# Patient Record
Sex: Female | Born: 1935 | Race: White | Hispanic: No | Smoking: Never smoker
Health system: Southern US, Community
[De-identification: ages and names within clinical notes are randomized; demographics above are authoritative.]

## PROBLEM LIST (undated history)

## (undated) DIAGNOSIS — I219 Acute myocardial infarction, unspecified: Secondary | ICD-10-CM

## (undated) DIAGNOSIS — E039 Hypothyroidism, unspecified: Secondary | ICD-10-CM

## (undated) DIAGNOSIS — I4891 Unspecified atrial fibrillation: Secondary | ICD-10-CM

## (undated) DIAGNOSIS — Z95810 Presence of automatic (implantable) cardiac defibrillator: Secondary | ICD-10-CM

## (undated) DIAGNOSIS — I10 Essential (primary) hypertension: Secondary | ICD-10-CM

## (undated) DIAGNOSIS — M1990 Unspecified osteoarthritis, unspecified site: Secondary | ICD-10-CM

## (undated) DIAGNOSIS — R011 Cardiac murmur, unspecified: Secondary | ICD-10-CM

## (undated) DIAGNOSIS — R42 Dizziness and giddiness: Secondary | ICD-10-CM

## (undated) DIAGNOSIS — E782 Mixed hyperlipidemia: Secondary | ICD-10-CM

## (undated) HISTORY — DX: Mixed hyperlipidemia: E78.2

## (undated) HISTORY — DX: Cardiac murmur, unspecified: R01.1

## (undated) HISTORY — PX: INSERT / REPLACE / REMOVE PACEMAKER: SUR710

## (undated) HISTORY — DX: Presence of automatic (implantable) cardiac defibrillator: Z95.810

## (undated) HISTORY — DX: Hypothyroidism, unspecified: E03.9

## (undated) HISTORY — DX: Unspecified osteoarthritis, unspecified site: M19.90

---

## 1998-03-30 ENCOUNTER — Other Ambulatory Visit: Admission: RE | Admit: 1998-03-30 | Discharge: 1998-03-30 | Payer: Self-pay | Admitting: *Deleted

## 1998-04-06 ENCOUNTER — Other Ambulatory Visit: Admission: RE | Admit: 1998-04-06 | Discharge: 1998-04-06 | Payer: Self-pay | Admitting: *Deleted

## 1999-04-12 ENCOUNTER — Other Ambulatory Visit: Admission: RE | Admit: 1999-04-12 | Discharge: 1999-04-12 | Payer: Self-pay | Admitting: *Deleted

## 2000-04-04 ENCOUNTER — Encounter: Payer: Self-pay | Admitting: *Deleted

## 2000-04-04 ENCOUNTER — Encounter: Admission: RE | Admit: 2000-04-04 | Discharge: 2000-04-04 | Payer: Self-pay | Admitting: *Deleted

## 2000-06-06 ENCOUNTER — Encounter: Admission: RE | Admit: 2000-06-06 | Discharge: 2000-06-06 | Payer: Self-pay | Admitting: *Deleted

## 2000-06-06 ENCOUNTER — Encounter: Payer: Self-pay | Admitting: *Deleted

## 2000-06-13 ENCOUNTER — Encounter: Payer: Self-pay | Admitting: *Deleted

## 2000-06-13 ENCOUNTER — Encounter: Admission: RE | Admit: 2000-06-13 | Discharge: 2000-06-13 | Payer: Self-pay | Admitting: *Deleted

## 2001-06-18 ENCOUNTER — Encounter: Admission: RE | Admit: 2001-06-18 | Discharge: 2001-06-18 | Payer: Self-pay | Admitting: Internal Medicine

## 2001-06-18 ENCOUNTER — Encounter: Payer: Self-pay | Admitting: Internal Medicine

## 2002-07-22 ENCOUNTER — Encounter: Admission: RE | Admit: 2002-07-22 | Discharge: 2002-07-22 | Payer: Self-pay | Admitting: Internal Medicine

## 2002-07-22 ENCOUNTER — Encounter: Payer: Self-pay | Admitting: Internal Medicine

## 2003-09-01 ENCOUNTER — Encounter: Admission: RE | Admit: 2003-09-01 | Discharge: 2003-09-01 | Payer: Self-pay | Admitting: Internal Medicine

## 2003-09-01 ENCOUNTER — Encounter: Payer: Self-pay | Admitting: Internal Medicine

## 2004-09-20 ENCOUNTER — Encounter: Admission: RE | Admit: 2004-09-20 | Discharge: 2004-09-20 | Payer: Self-pay | Admitting: Internal Medicine

## 2005-10-29 ENCOUNTER — Inpatient Hospital Stay (HOSPITAL_COMMUNITY): Admission: AD | Admit: 2005-10-29 | Discharge: 2005-11-09 | Payer: Self-pay | Admitting: Cardiology

## 2005-10-30 ENCOUNTER — Ambulatory Visit: Payer: Self-pay | Admitting: Internal Medicine

## 2005-10-30 ENCOUNTER — Encounter: Payer: Self-pay | Admitting: Internal Medicine

## 2005-11-01 ENCOUNTER — Ambulatory Visit: Payer: Self-pay | Admitting: Pulmonary Disease

## 2005-11-01 ENCOUNTER — Encounter: Payer: Self-pay | Admitting: Cardiology

## 2005-11-01 ENCOUNTER — Ambulatory Visit: Payer: Self-pay | Admitting: Cardiology

## 2005-11-17 ENCOUNTER — Ambulatory Visit: Payer: Self-pay | Admitting: Internal Medicine

## 2005-12-15 ENCOUNTER — Observation Stay (HOSPITAL_COMMUNITY): Admission: AD | Admit: 2005-12-15 | Discharge: 2005-12-16 | Payer: Self-pay | Admitting: Internal Medicine

## 2005-12-16 ENCOUNTER — Ambulatory Visit: Payer: Self-pay | Admitting: Internal Medicine

## 2005-12-28 ENCOUNTER — Ambulatory Visit: Payer: Self-pay | Admitting: Internal Medicine

## 2006-03-13 ENCOUNTER — Ambulatory Visit: Payer: Self-pay | Admitting: Internal Medicine

## 2006-05-01 ENCOUNTER — Ambulatory Visit: Payer: Self-pay | Admitting: Internal Medicine

## 2006-06-11 ENCOUNTER — Ambulatory Visit: Payer: Self-pay

## 2006-08-03 ENCOUNTER — Ambulatory Visit: Payer: Self-pay | Admitting: Internal Medicine

## 2006-10-30 ENCOUNTER — Ambulatory Visit: Payer: Self-pay | Admitting: Internal Medicine

## 2006-12-26 ENCOUNTER — Ambulatory Visit: Payer: Self-pay | Admitting: Internal Medicine

## 2007-01-10 ENCOUNTER — Ambulatory Visit: Payer: Self-pay | Admitting: Internal Medicine

## 2007-01-10 LAB — CONVERTED CEMR LAB
AST: 35 units/L (ref 0–37)
Albumin: 3.5 g/dL (ref 3.5–5.2)
Bilirubin, Direct: 0.1 mg/dL (ref 0.0–0.3)
Total Bilirubin: 0.7 mg/dL (ref 0.3–1.2)
Total Protein: 6.9 g/dL (ref 6.0–8.3)

## 2007-04-05 ENCOUNTER — Ambulatory Visit: Payer: Self-pay | Admitting: Internal Medicine

## 2007-08-02 ENCOUNTER — Encounter: Admission: RE | Admit: 2007-08-02 | Discharge: 2007-08-02 | Payer: Self-pay | Admitting: Internal Medicine

## 2007-08-16 ENCOUNTER — Ambulatory Visit (HOSPITAL_COMMUNITY): Admission: RE | Admit: 2007-08-16 | Discharge: 2007-08-16 | Payer: Self-pay | Admitting: Interventional Radiology

## 2007-08-16 ENCOUNTER — Encounter: Admission: RE | Admit: 2007-08-16 | Discharge: 2007-08-16 | Payer: Self-pay | Admitting: Internal Medicine

## 2007-08-23 ENCOUNTER — Encounter: Admission: RE | Admit: 2007-08-23 | Discharge: 2007-08-23 | Payer: Self-pay | Admitting: Radiology

## 2007-09-02 ENCOUNTER — Ambulatory Visit (HOSPITAL_COMMUNITY): Admission: RE | Admit: 2007-09-02 | Discharge: 2007-09-02 | Payer: Self-pay | Admitting: Radiology

## 2007-09-02 ENCOUNTER — Encounter (INDEPENDENT_AMBULATORY_CARE_PROVIDER_SITE_OTHER): Payer: Self-pay | Admitting: Radiology

## 2007-09-16 ENCOUNTER — Encounter: Admission: RE | Admit: 2007-09-16 | Discharge: 2007-09-16 | Payer: Self-pay | Admitting: Radiology

## 2007-10-23 ENCOUNTER — Encounter: Admission: RE | Admit: 2007-10-23 | Discharge: 2007-10-23 | Payer: Self-pay | Admitting: Radiology

## 2007-10-28 ENCOUNTER — Encounter: Admission: RE | Admit: 2007-10-28 | Discharge: 2007-10-28 | Payer: Self-pay | Admitting: Radiology

## 2007-11-06 ENCOUNTER — Encounter: Admission: RE | Admit: 2007-11-06 | Discharge: 2007-11-06 | Payer: Self-pay | Admitting: Radiology

## 2007-11-18 ENCOUNTER — Encounter: Admission: RE | Admit: 2007-11-18 | Discharge: 2007-11-18 | Payer: Self-pay | Admitting: Radiology

## 2008-01-03 ENCOUNTER — Ambulatory Visit: Payer: Self-pay | Admitting: Internal Medicine

## 2008-01-28 ENCOUNTER — Ambulatory Visit: Payer: Self-pay | Admitting: Internal Medicine

## 2008-01-28 LAB — CONVERTED CEMR LAB
ALT: 33 units/L (ref 0–35)
AST: 41 units/L — ABNORMAL HIGH (ref 0–37)
Alkaline Phosphatase: 58 units/L (ref 39–117)
Basophils Relative: 0.3 % (ref 0.0–1.0)
Bilirubin, Direct: 0.1 mg/dL (ref 0.0–0.3)
Eosinophils Absolute: 0.1 10*3/uL (ref 0.0–0.6)
Eosinophils Relative: 0.7 % (ref 0.0–5.0)
HCT: 42.2 % (ref 36.0–46.0)
Neutrophils Relative %: 70.8 % (ref 43.0–77.0)
Platelets: 252 10*3/uL (ref 150–400)
RBC: 4.26 M/uL (ref 3.87–5.11)
RDW: 14.2 % (ref 11.5–14.6)
Total Protein: 7.3 g/dL (ref 6.0–8.3)
WBC: 8.1 10*3/uL (ref 4.5–10.5)

## 2008-03-18 ENCOUNTER — Encounter: Admission: RE | Admit: 2008-03-18 | Discharge: 2008-03-18 | Payer: Self-pay | Admitting: Neurology

## 2008-04-27 ENCOUNTER — Ambulatory Visit: Payer: Self-pay | Admitting: Internal Medicine

## 2008-07-27 ENCOUNTER — Ambulatory Visit: Payer: Self-pay | Admitting: Internal Medicine

## 2008-08-19 ENCOUNTER — Ambulatory Visit: Payer: Self-pay | Admitting: Internal Medicine

## 2008-10-26 ENCOUNTER — Ambulatory Visit: Payer: Self-pay | Admitting: Internal Medicine

## 2009-01-14 ENCOUNTER — Ambulatory Visit: Payer: Self-pay | Admitting: Internal Medicine

## 2009-03-16 ENCOUNTER — Encounter: Payer: Self-pay | Admitting: Internal Medicine

## 2009-04-22 ENCOUNTER — Encounter: Payer: Self-pay | Admitting: Internal Medicine

## 2009-04-29 ENCOUNTER — Ambulatory Visit: Payer: Self-pay | Admitting: Internal Medicine

## 2009-05-07 ENCOUNTER — Encounter: Payer: Self-pay | Admitting: Internal Medicine

## 2009-05-09 ENCOUNTER — Emergency Department (HOSPITAL_COMMUNITY): Admission: RE | Admit: 2009-05-09 | Discharge: 2009-05-09 | Payer: Self-pay | Admitting: Family Medicine

## 2009-08-16 DIAGNOSIS — Z8679 Personal history of other diseases of the circulatory system: Secondary | ICD-10-CM | POA: Insufficient documentation

## 2009-08-16 DIAGNOSIS — E039 Hypothyroidism, unspecified: Secondary | ICD-10-CM | POA: Insufficient documentation

## 2009-08-16 DIAGNOSIS — M129 Arthropathy, unspecified: Secondary | ICD-10-CM | POA: Insufficient documentation

## 2009-08-16 DIAGNOSIS — M810 Age-related osteoporosis without current pathological fracture: Secondary | ICD-10-CM | POA: Insufficient documentation

## 2009-08-16 DIAGNOSIS — E782 Mixed hyperlipidemia: Secondary | ICD-10-CM

## 2009-08-17 ENCOUNTER — Ambulatory Visit: Payer: Self-pay | Admitting: Internal Medicine

## 2009-08-17 DIAGNOSIS — I428 Other cardiomyopathies: Secondary | ICD-10-CM

## 2009-08-17 DIAGNOSIS — R42 Dizziness and giddiness: Secondary | ICD-10-CM

## 2009-11-15 ENCOUNTER — Ambulatory Visit: Payer: Self-pay | Admitting: Internal Medicine

## 2009-11-24 ENCOUNTER — Encounter: Payer: Self-pay | Admitting: Internal Medicine

## 2009-12-06 ENCOUNTER — Telehealth: Payer: Self-pay | Admitting: Internal Medicine

## 2009-12-29 ENCOUNTER — Telehealth: Payer: Self-pay | Admitting: Internal Medicine

## 2010-02-28 ENCOUNTER — Encounter: Payer: Self-pay | Admitting: Internal Medicine

## 2010-03-30 ENCOUNTER — Encounter: Payer: Self-pay | Admitting: Internal Medicine

## 2010-03-30 ENCOUNTER — Ambulatory Visit: Payer: Self-pay

## 2010-08-16 ENCOUNTER — Telehealth: Payer: Self-pay | Admitting: Internal Medicine

## 2010-08-19 ENCOUNTER — Inpatient Hospital Stay (HOSPITAL_COMMUNITY): Admission: EM | Admit: 2010-08-19 | Discharge: 2010-08-25 | Payer: Self-pay | Admitting: Emergency Medicine

## 2010-10-13 ENCOUNTER — Emergency Department (HOSPITAL_COMMUNITY): Admission: EM | Admit: 2010-10-13 | Discharge: 2010-10-13 | Payer: Self-pay | Admitting: Emergency Medicine

## 2010-11-03 ENCOUNTER — Encounter (INDEPENDENT_AMBULATORY_CARE_PROVIDER_SITE_OTHER): Payer: Self-pay | Admitting: *Deleted

## 2010-11-15 ENCOUNTER — Ambulatory Visit: Payer: Self-pay | Admitting: Internal Medicine

## 2010-11-15 DIAGNOSIS — I359 Nonrheumatic aortic valve disorder, unspecified: Secondary | ICD-10-CM | POA: Insufficient documentation

## 2011-01-08 ENCOUNTER — Encounter: Payer: Self-pay | Admitting: Radiology

## 2011-01-08 ENCOUNTER — Encounter: Payer: Self-pay | Admitting: Internal Medicine

## 2011-01-19 NOTE — Progress Notes (Signed)
Summary: pt has questions   Phone Note Call from Patient Call back at [phone]   Caller: Friend/Kay Ann Maki Reason for Call: Talk to Nurse, Talk to Doctor Summary of Call: pt can not make appt because she is not feeling well and can't make an appt she is very concerned about being charged the $50.00 Initial call taken by: Omer Jack,  August 16, 2010 8:45 AM  Follow-up for Phone Call        PT'S FRIEND INFORMED WILL NOT BE CHARGED THE $50.00 CX FEE. Follow-up by: Scherrie Bateman, LPN,  August 16, 2010 10:25 AM

## 2011-01-19 NOTE — Cardiovascular Report (Signed)
Summary: Office Visit   Office Visit   Imported By: Roderic Ovens 04/06/2010 16:25:18  _____________________________________________________________________  External Attachment:    Type:   Image     Comment:   External Document

## 2011-01-19 NOTE — Progress Notes (Signed)
Summary: med question   Phone Note Call from Patient Call back at Home Phone [phone]   Caller: Patient Summary of Call: is she still suppose to be taking carvedilol? Initial call taken by: Migdalia Dk,  December 29, 2009 10:46 AM  Follow-up for Phone Call        Pt was confused, someone told her that she was not supposed to be taking anything from Dr. Graciela Husbands. She just wanted to make sure. I do not see any notes where at any time this was stopped. So, she should be taking it.  Follow-up by: Duncan Dull, RN, BSN,  December 29, 2009 11:08 AM

## 2011-01-19 NOTE — Assessment & Plan Note (Signed)
Summary: pc2  Medications Added LEVOTHYROXINE SODIUM 50 MCG TABS (LEVOTHYROXINE SODIUM) 1 tablet once daily LYRICA 25 MG CAPS (PREGABALIN) once daily MIRTAZAPINE 15 MG TABS (MIRTAZAPINE) 1/2 tablet once daily ROPINIROLE HCL 0.25 MG TABS (ROPINIROLE HCL) once daily DONEPEZIL HCL 10 MG TABS (DONEPEZIL HCL) once daily      Allergies Added: NKDA  CC:  dizziness.  History of Present Illness: [name] [name] is seen in followup for polymorphic ventricular tachycardia and syncope for which she takes amiodarone. She has a nonischemic cardiomyopathy.   She continues to complain of dizziness. According to her she has not been referred to neurology. I think we have to make a decision as related to the amiodarone at least until we can exclude that.  She is also having problems with anorexia which prompted a temporary nursing home stay for which he is just recently returned home.  The patient denies SOB, chest pain, edema or palpitations    Problems Prior to Update: 1)  Aortic Stenosis  (ICD-424.1) 2)  Ventricular Tachycardia-polymorphic  (ICD-427.1) 3)  Implantation of Defibrillator,st J  (ICD-V45.02) 4)  Dizziness, Chronic  (ICD-780.4) 5)  Cardiomyopathy, Primary, Dilated  (ICD-425.4) 6)  Hyperlipidemia, Mixed  (ICD-272.2) 7)  Heart Murmur, Hx of  (ICD-V12.50) 8)  Arthritis  (ICD-716.90) 9)  Osteoporosis  (ICD-733.00) 10)  Hypothyroidism  (ICD-244.9)  Current Medications (verified): 1)  Amiodarone Hcl 200 Mg Tabs (Amiodarone Hcl) .... Take One Tablet Once Daily 2)  Aspirin 81 Mg Tbec (Aspirin) .... Take One Tablet By Mouth Daily 3)  Levothyroxine Sodium 50 Mcg Tabs (Levothyroxine Sodium) .Marland Kitchen.. 1 Tablet Once Daily 4)  Preservision/lutein  Caps (Multiple Vitamins-Minerals) .... Once Daily 5)  Vitamin D3 2000 Unit Caps (Cholecalciferol) .... Once Daily 6)  Simvastatin 40 Mg Tabs (Simvastatin) .... At Bedtime 7)  Coreg 3.125 Mg Tabs (Carvedilol) .... Once Daily 8)  Folast 2.8-25-2 Mg Tabs  (L-Methylfolate-B6-B12) .... One By Mouth Daily 9)  Fludrocortisone Acetate 0.1 Mg Tabs (Fludrocortisone Acetate) .... One By Mouth Daily 10)  Lyrica 25 Mg Caps (Pregabalin) .... Once Daily 11)  Mirtazapine 15 Mg Tabs (Mirtazapine) .... 1/2 Tablet Once Daily 12)  Ropinirole Hcl 0.25 Mg Tabs (Ropinirole Hcl) .... Once Daily 13)  Donepezil Hcl 10 Mg Tabs (Donepezil Hcl) .... Once Daily  Allergies (verified): No Known Drug Allergies  Past History:  Past Medical History: Last updated: 08/16/2009 Current Problems:  HYPERLIPIDEMIA, MIXED (ICD-272.2) HEART MURMUR, HX OF (ICD-V12.50) ARTHRITIS (ICD-716.90) OSTEOPOROSIS (ICD-733.00) HYPOTHYROIDISM (ICD-244.9)    Family History: Last updated: 08/16/2009 Family History of Coronary Artery Disease:  Family History of CVA or Stroke:   Social History: Last updated: 08/16/2009 Retired  Widowed  Tobacco Use - No.  Alcohol Use - no Drug Use - no  Risk Factors: Smoking Status: never (08/16/2009)  Vital Signs:  Patient profile:   77 year old female Height:      60 inches Weight:      98.19 pounds BMI:     19.25 Pulse rate:   65 / minute BP sitting:   137 / 76  (left arm) Cuff size:   regular  Vitals Entered By: Caralee Ates CMA (November 15, 2010 9:43 AM)  Physical Exam  General:  The patient was alert and oriented in no acute distress. HEENT Normal.  Neck veins were flat, carotids were delayed Lungs were clear.  Heart sounds were regular with A 2/6 systolic murmur with an single second heart sound Abdomen was soft with active bowel sounds. There is no clubbing  cyanosis or edema. Skin Warm and dry     ICD Specifications Following MD:  Sherryl Manges, MD     ICD Vendor:  St Jude     ICD Model Number:  [id number]     ICD Serial Number:  [id number] ICD DOI:  12/15/2005     ICD Implanting MD:  Sherryl Manges, MD  Lead 1:    Location: RV     DOI: 12/15/2005     Model #: 7000     Serial #: [id number]     Status: active  Indications::   PMVT   ICD Follow Up Battery Voltage:  2.55 V     Charge Time:  13.4 seconds     Underlying rhythm:  SR ICD Dependent:  No       ICD Device Measurements Right Ventricle:  Amplitude: 12.0 mV, Impedance: 345 ohms, Threshold: 2.0 V at 0.5 msec Shock Impedance: 29 ohms   Episodes MS Episodes:  0     Coumadin:  No Shock:  0     ATP:  0     Nonsustained:  0     Atrial Therapies:  0 Ventricular Pacing:  <1%  Brady Parameters Mode VVI     Lower Rate Limit:  40      Tachy Zones VF:  231     VT:  185     Tech Comments:  NORMAL DEVICE FUNCTION.  NO EPISODES SINCE LAST CHECK.  CHANGED RV OUTPUT FROM 2.75 TO 3.5 V. BATTERY VOLTAGE 2.55--PER SJM BATTERY LONGEVITY 8-12 MTHS TO ERI. ROV IN 3 MTHS W/DEVICE CLINIC. Vella Kohler  November 15, 2010 9:45 AM  Impression & Recommendations:  Problem # 1:  VENTRICULAR TACHYCARDIA-POLYMORPHIC (ICD-427.1)  The patient takes amiodarone; because of the potential treatable side effects i.e. dizziness and anorexia, we will stop the amiodarone. I have some concerns about this is related to her ventricular tachycardia.    Problem # 2:  DIZZINESS, CHRONIC (ICD-780.4) as above; she is still on Florinef.  Problem # 3:  AORTIC STENOSIS (ICD-424.1) haortic stenosis was moderate by echo in 2006. Given her age and comorbidities, I am not inclined to pursue therapy of it at this time.  Her updated medication list for this problem includes:    Coreg 3.125 Mg Tabs (Carvedilol) ..... Once daily  Problem # 4:  CARDIOMYOPATHY, PRIMARY, DILATED (ICD-425.4)  we'll continue her on her current medications;  I need to review the old records to see whether she is a candidate for an ACE inhibitor given her LV dysfunction. Prior to initiating at the eye would recheck echo as to avoid the potential morbidities of the medication  The following medications were removed from the medication list:    Amiodarone Hcl 200 Mg Tabs (Amiodarone hcl) .Marland Kitchen... Take one tablet once daily     Amlodipine Besylate 5 Mg Tabs (Amlodipine besylate) ..... Once daily Her updated medication list for this problem includes:    Aspirin 81 Mg Tbec (Aspirin) .Marland Kitchen... Take one tablet by mouth daily    Coreg 3.125 Mg Tabs (Carvedilol) ..... Once daily  Patient Instructions: 1)  Your physician recommends that you schedule a follow-up appointment in: 3 months with pacer clinic 2)  Your physician has recommended you make the following change in your medication: Stop Amiodarone 3)  Your physician wants you to follow-up in:  6 months. You will receive a reminder letter in the mail two months in advance. If you don't receive a letter, please call our office  to schedule the follow-up appointment.

## 2011-01-19 NOTE — Cardiovascular Report (Signed)
Summary: Office Visit   Office Visit   Imported By: Roderic Ovens 11/21/2010 16:16:28  _____________________________________________________________________  External Attachment:    Type:   Image     Comment:   External Document

## 2011-01-19 NOTE — Letter (Signed)
Summary: Device-Delinquent Check  [facility], Main Office  [address]   [place], [place] [address]   Phone: [phone]  Fax: [phone]     November 03, 2010 MRN: [id number]   JOYCE HEITMAN [address] [place], [place]  [address]   Dear Ms. Cleary,  According to our records, you have not had your implanted device checked in the recommended period of time.  We are unable to determine appropriate device function without checking your device on a regular basis.  Please call our office to schedule an appointment , with Dr Graciela Husbands, as soon as possible.  If you are having your device checked by another physician, please call us so that we may update our records.  Thank you,  Letta Moynahan, EMT  November 03, 2010 11:17 AM  [facility]

## 2011-01-19 NOTE — Procedures (Signed)
Summary: defib [email or web address]  Medications Added * LEVOTHROID 50 MCG TABS (LEVOTHYROXINE SODIUM) one by mouth daily * SIMVASTATIN 40 MG TABS (SIMVASTATIN) at bedtime FOLAST 2.8-25-2 MG TABS (L-METHYLFOLATE-B6-B12) one by mouth daily FLUDROCORTISONE ACETATE 0.1 MG TABS (FLUDROCORTISONE ACETATE) one by mouth daily      Allergies Added: NKDA  Current Medications (verified): 1)  Amiodarone Hcl 200 Mg Tabs (Amiodarone Hcl) .... Take One Tablet Once Daily 2)  Aspirin 81 Mg Tbec (Aspirin) .... Take One Tablet By Mouth Daily 3)  Levothroid 50 Mcg Tabs (Levothyroxine Sodium) .... One By Mouth Daily 4)  Preservision/lutein  Caps (Multiple Vitamins-Minerals) .... Once Daily 5)  Vitamin D3 2000 Unit Caps (Cholecalciferol) .... Once Daily 6)  Simvastatin 40 Mg Tabs (Simvastatin) .... At Bedtime 7)  Amlodipine Besylate 5 Mg Tabs (Amlodipine Besylate) .... Once Daily 8)  Coreg 3.125 Mg Tabs (Carvedilol) .... Once Daily 9)  Folast 2.8-25-2 Mg Tabs (L-Methylfolate-B6-B12) .... One By Mouth Daily 10)  Fludrocortisone Acetate 0.1 Mg Tabs (Fludrocortisone Acetate) .... One By Mouth Daily  Allergies (verified): No Known Drug Allergies   ICD Specifications Following MD:  Sherryl Manges, MD     ICD Vendor:  St Jude     ICD Model Number:  [phone]     ICD Serial Number:  [id number] ICD DOI:  12/15/2005     ICD Implanting MD:  Sherryl Manges, MD  Lead 1:    Location: RV     DOI: 12/15/2005     Model #: 7000     Serial #: [id number]     Status: active  Indications::  PMVT   ICD Follow Up Remote Check?  No Battery Voltage:  2.61 V     Charge Time:  12.5 seconds     Underlying rhythm:  Brady @ 50 ICD Dependent:  No       ICD Device Measurements Right Ventricle:  Amplitude: 12 mV, Impedance: 345 ohms, Threshold: 1.75 V at 0.8 msec  Episodes Coumadin:  No Shock:  0     ATP:  0     Nonsustained:  0     Ventricular Pacing:  0%  Brady Parameters Mode VVI     Lower Rate Limit:  40      Tachy Zones VF:  231      VT:  185     Next Cardiology Appt Due:  06/17/2010 Tech Comments:  No parameter changes.  Device function normal.  ROV 3 months with Dr. Graciela Husbands.  We will restart Merlin @ that time. Altha Harm, LPN  March 30, 2010 2:26 PM

## 2011-01-19 NOTE — Letter (Signed)
Summary: Device-Delinquent Phone Journalist, newspaper, Main Office  [address]   [place], [place] [address]   Phone: [phone]  Fax: [phone]     February 28, 2010 MRN: [id number]   [name] [name] [address] [place], [place]  [address]   Dear Ms. Sobocinski,  According to our records, you were scheduled for a device phone transmission on  February 14, 2010.     We did not receive any results from this check.  If you transmitted on your scheduled day, please call us to help troubleshoot your system.  If you forgot to send your transmission, please send one upon receipt of this letter.  Thank you,   Architectural technologist Device Clinic

## 2011-01-31 ENCOUNTER — Encounter (INDEPENDENT_AMBULATORY_CARE_PROVIDER_SITE_OTHER): Payer: Self-pay | Admitting: *Deleted

## 2011-02-08 NOTE — Letter (Signed)
Summary: Appointment - Missed  [facility], Main Office  [address]   [place], [place] [address]   Phone: [phone]  Fax: [phone]     January 31, 2011 MRN: [id number]   [name] [name] [address] [place], [place]  [address]   Dear Ms. Paez,  Our records indicate you missed your appointment on 2.8.12 with the Device Clinic for your pacemaker check . It is very important that we reach you to reschedule this appointment. We look forward to participating in your health care needs. Please contact us at the number listed above at your earliest convenience to reschedule this appointment.     Sincerely,    Glass blower/designer

## 2011-02-16 ENCOUNTER — Encounter: Payer: Self-pay | Admitting: Internal Medicine

## 2011-02-16 ENCOUNTER — Encounter (INDEPENDENT_AMBULATORY_CARE_PROVIDER_SITE_OTHER): Payer: Medicare Other

## 2011-02-16 DIAGNOSIS — I428 Other cardiomyopathies: Secondary | ICD-10-CM

## 2011-03-01 LAB — RAPID URINE DRUG SCREEN, HOSP PERFORMED
Barbiturates: NOT DETECTED
Benzodiazepines: NOT DETECTED
Cocaine: NOT DETECTED

## 2011-03-01 LAB — URINALYSIS, ROUTINE W REFLEX MICROSCOPIC
Nitrite: NEGATIVE
Protein, ur: NEGATIVE mg/dL
Specific Gravity, Urine: 1.005 (ref 1.005–1.030)
Urobilinogen, UA: 0.2 mg/dL (ref 0.0–1.0)

## 2011-03-01 LAB — POCT CARDIAC MARKERS
CKMB, poc: 4.8 ng/mL (ref 1.0–8.0)
CKMB, poc: 5.3 ng/mL (ref 1.0–8.0)
Myoglobin, poc: 99.4 ng/mL (ref 12–200)
Troponin i, poc: <0.05 ng/mL (ref 0.00–0.09)
Troponin i, poc: <0.05 ng/mL (ref 0.00–0.09)

## 2011-03-01 LAB — DIFFERENTIAL
Basophils Absolute: 0 10*3/uL (ref 0.0–0.1)
Eosinophils Relative: 1 % (ref 0–5)
Lymphocytes Relative: 14 % (ref 12–46)
Lymphs Abs: 1.4 10*3/uL (ref 0.7–4.0)
Monocytes Absolute: 1.2 10*3/uL — ABNORMAL HIGH (ref 0.1–1.0)
Monocytes Relative: 12 % (ref 3–12)

## 2011-03-01 LAB — COMPREHENSIVE METABOLIC PANEL
AST: 33 U/L (ref 0–37)
Albumin: 3 g/dL — ABNORMAL LOW (ref 3.5–5.2)
Chloride: 102 mEq/L (ref 96–112)
Creatinine, Ser: 0.86 mg/dL (ref 0.4–1.2)
GFR calc Af Amer: >60 mL/min (ref >60)
Total Bilirubin: 0.6 mg/dL (ref 0.3–1.2)
Total Protein: 6.6 g/dL (ref 6.0–8.3)

## 2011-03-01 LAB — CBC
HCT: 35.4 % — ABNORMAL LOW (ref 36.0–46.0)
Hemoglobin: 12.1 g/dL (ref 12.0–15.0)
MCHC: 34.2 g/dL (ref 30.0–36.0)
MCV: 93.9 fL (ref 78.0–100.0)
Platelets: 219 10*3/uL (ref 150–400)
RDW: 13.9 % (ref 11.5–15.5)

## 2011-03-01 LAB — URINE MICROSCOPIC-ADD ON

## 2011-03-01 LAB — ETHANOL: Alcohol, Ethyl (B): <5 mg/dL (ref 0–10)

## 2011-03-01 LAB — URINE CULTURE

## 2011-03-01 LAB — TRICYCLICS SCREEN, URINE: TCA Scrn: NOT DETECTED

## 2011-03-02 LAB — TSH: TSH: 11.281 u[IU]/mL — ABNORMAL HIGH (ref 0.350–4.500)

## 2011-03-02 LAB — URINE MICROSCOPIC-ADD ON

## 2011-03-02 LAB — CBC
HCT: 36.4 % (ref 36.0–46.0)
Hemoglobin: 12.5 g/dL (ref 12.0–15.0)
MCH: 32.7 pg (ref 26.0–34.0)
MCHC: 35.8 g/dL (ref 30.0–36.0)
MCV: 91.4 fL (ref 78.0–100.0)
Platelets: 194 10*3/uL (ref 150–400)
RBC: 3.88 MIL/uL (ref 3.87–5.11)
RDW: 14.4 % (ref 11.5–15.5)
WBC: 6.8 10*3/uL (ref 4.0–10.5)

## 2011-03-02 LAB — URINALYSIS, ROUTINE W REFLEX MICROSCOPIC
Bilirubin Urine: NEGATIVE
Ketones, ur: NEGATIVE mg/dL
Nitrite: NEGATIVE
Specific Gravity, Urine: 1.008 (ref 1.005–1.030)
Urobilinogen, UA: 0.2 mg/dL (ref 0.0–1.0)
pH: 7.5 (ref 5.0–8.0)

## 2011-03-02 LAB — BASIC METABOLIC PANEL
BUN: 10 mg/dL (ref 6–23)
BUN: 13 mg/dL (ref 6–23)
CO2: 28 mEq/L (ref 19–32)
CO2: 31 mEq/L (ref 19–32)
Calcium: 8.4 mg/dL (ref 8.4–10.5)
Calcium: 8.8 mg/dL (ref 8.4–10.5)
Calcium: 9.3 mg/dL (ref 8.4–10.5)
Chloride: 92 mEq/L — ABNORMAL LOW (ref 96–112)
Chloride: 95 mEq/L — ABNORMAL LOW (ref 96–112)
Creatinine, Ser: 0.79 mg/dL (ref 0.4–1.2)
Creatinine, Ser: 0.87 mg/dL (ref 0.4–1.2)
GFR calc Af Amer: >60 mL/min (ref >60)
GFR calc Af Amer: >60 mL/min (ref >60)
GFR calc Af Amer: >60 mL/min (ref >60)
GFR calc non Af Amer: >60 mL/min (ref >60)
GFR calc non Af Amer: >60 mL/min (ref >60)
GFR calc non Af Amer: >60 mL/min (ref >60)
Glucose, Bld: 86 mg/dL (ref 70–99)
Glucose, Bld: 98 mg/dL (ref 70–99)
Potassium: 4.3 mEq/L (ref 3.5–5.1)
Potassium: 4.7 mEq/L (ref 3.5–5.1)
Sodium: 125 mEq/L — ABNORMAL LOW (ref 135–145)
Sodium: 128 mEq/L — ABNORMAL LOW (ref 135–145)
Sodium: 132 mEq/L — ABNORMAL LOW (ref 135–145)

## 2011-03-02 LAB — CK TOTAL AND CKMB (NOT AT ARMC)
CK, MB: 20 ng/mL (ref 0.3–4.0)
CK, MB: 8.4 ng/mL (ref 0.3–4.0)
Relative Index: 3.9 — ABNORMAL HIGH (ref 0.0–2.5)
Total CK: 199 U/L — ABNORMAL HIGH (ref 7–177)

## 2011-03-02 LAB — DIFFERENTIAL
Basophils Absolute: 0 10*3/uL (ref 0.0–0.1)
Basophils Relative: 0 % (ref 0–1)
Eosinophils Absolute: 0.1 10*3/uL (ref 0.0–0.7)
Eosinophils Relative: 1 % (ref 0–5)
Lymphs Abs: 1.6 10*3/uL (ref 0.7–4.0)
Neutrophils Relative %: 67 % (ref 43–77)

## 2011-03-02 LAB — URINE CULTURE
Culture  Setup Time: 201109030209
Culture: NO GROWTH

## 2011-03-02 LAB — TROPONIN I: Troponin I: 0.04 ng/mL (ref 0.00–0.06)

## 2011-03-02 LAB — CARDIAC PANEL(CRET KIN+CKTOT+MB+TROPI)
CK, MB: 18.7 ng/mL (ref 0.3–4.0)
Total CK: 447 U/L — ABNORMAL HIGH (ref 7–177)
Troponin I: 0.04 ng/mL (ref 0.00–0.06)

## 2011-03-02 LAB — T4, FREE: Free T4: 1.23 ng/dL (ref 0.80–1.80)

## 2011-03-02 LAB — HEPATIC FUNCTION PANEL
ALT: 42 U/L — ABNORMAL HIGH (ref 0–35)
Albumin: 3.7 g/dL (ref 3.5–5.2)
Alkaline Phosphatase: 60 U/L (ref 39–117)
Indirect Bilirubin: 0.5 mg/dL (ref 0.3–0.9)
Total Bilirubin: 0.6 mg/dL (ref 0.3–1.2)

## 2011-03-02 LAB — GLUCOSE, CAPILLARY: Glucose-Capillary: 95 mg/dL (ref 70–99)

## 2011-03-02 LAB — MAGNESIUM
Magnesium: 1.5 mg/dL (ref 1.5–2.5)
Magnesium: 1.7 mg/dL (ref 1.5–2.5)

## 2011-03-02 LAB — FOLATE: Folate: >20 ng/mL

## 2011-03-02 LAB — CK: Total CK: 408 U/L — ABNORMAL HIGH (ref 7–177)

## 2011-03-07 NOTE — Procedures (Signed)
Summary: device/ssaf mca      Allergies Added: NKDA  Current Medications (verified): 1)  Aspirin 81 Mg Tbec (Aspirin) .... Take One Tablet By Mouth Daily 2)  Levothyroxine Sodium 50 Mcg Tabs (Levothyroxine Sodium) .Marland Kitchen.. 1 Tablet Once Daily 3)  Preservision/lutein  Caps (Multiple Vitamins-Minerals) .... Once Daily 4)  Vitamin D3 2000 Unit Caps (Cholecalciferol) .... Once Daily 5)  Simvastatin 40 Mg Tabs (Simvastatin) .... At Bedtime 6)  Coreg 3.125 Mg Tabs (Carvedilol) .... Once Daily 7)  Folast 2.8-25-2 Mg Tabs (L-Methylfolate-B6-B12) .... One By Mouth Daily 8)  Fludrocortisone Acetate 0.1 Mg Tabs (Fludrocortisone Acetate) .... One By Mouth Daily 9)  Lyrica 25 Mg Caps (Pregabalin) .... Once Daily 10)  Mirtazapine 15 Mg Tabs (Mirtazapine) .... 1/2 Tablet Once Daily 11)  Ropinirole Hcl 0.25 Mg Tabs (Ropinirole Hcl) .... Once Daily 12)  Donepezil Hcl 10 Mg Tabs (Donepezil Hcl) .... Once Daily  Allergies (verified): No Known Drug Allergies   ICD Specifications Following MD:  Sherryl Manges, MD     ICD Vendor:  St Jude     ICD Model Number:  [phone]     ICD Serial Number:  [id number] ICD DOI:  12/15/2005     ICD Implanting MD:  Sherryl Manges, MD  Lead 1:    Location: RV     DOI: 12/15/2005     Model #: 7000     Serial #: [id number]     Status: active  Indications::  PMVT   ICD Follow Up ICD Dependent:  No      Episodes Coumadin:  No  Brady Parameters Mode VVI     Lower Rate Limit:  40      Tachy Zones VF:  231     VT:  185     Tech Comments:  see paceart report.

## 2011-03-07 NOTE — Cardiovascular Report (Signed)
Summary: Office Visit   Office Visit   Imported By: Roderic Ovens 02/27/2011 14:48:55  _____________________________________________________________________  External Attachment:    Type:   Image     Comment:   External Document

## 2011-05-02 NOTE — Letter (Signed)
January 28, 2008    [name] [name]. [name] [name], M.D.  [address]  [address]  [place], [place] [address]   RE:  [name], [name]  MRN:  [id number]  /  DOB:  28-Jan-1919   Dear [name] [name],   I hope you and [name] [name] and all are doing well.  I was sorry to hear about  [name] [name]'s departure.   [name] [name] is a lady that I have followed with him for some time.  She has a history of polymorphic ventricular tachycardia, status post  ICD implantation, and has been bothered by dizziness for some period of  time.  We initially thought this might be related to the amiodarone we  tried on and off by telephone to see if this had any impact and it did  not.  On further history today, it is clear that this is, at least in  large part, orthostatic and this was confirmed, see below.   Her other medications include amiodarone 200 a day and metoprolol 25 a  day.   EXAMINATION:  Her blood pressure is 146/75 with a pulse of 53.  With  standing, it went to 108/64 with gradual recovery.  LUNGS:  Her lungs were clear.  HEART:  Her heart sounds were regular.  EXTREMITIES:  Without edema.  SKIN:  Warm and dry.  NEUROLOGICAL EXAM:  Grossly normal.   Interrogation of her St.  Jude ICD demonstrates an R-wave with impedance  of 375, a threshold of 1.75 at 0.5.  Variability is 3.15.  There were  two intercurrent episodes, but both appeared to be sinus.   IMPRESSION:  1. Ventricular tachycardia - polymorphic.  2. Status post ICD for the above.  3. Recurrent dizziness, consistent with evidence of orthostatic      hypotension.  4. Coronary artery disease with near-normal left ventricular function,      prior MI and non-revascularizable.  5. Treated hypothyroidism.   DISCUSSION:  [name] [name], [name] [name] is doing pretty well.  We will check her  amiodarone and surveillance laboratories today.   The most impressive thing is her orthostatic hypotension and I have  suggested that she do the following.   1. Raise the head of her bed  up on 6-inch blocks.  2. Have a glass of water to drink when she gets started.  3. Take her shower at night instead of in the morning, because this is      a major problem for her.  4. I am going to give her a prescription for midodrine 2.5 mg to take      twice daily, which she is to start if she had persistent symptoms.      I have asked that she follow up with you within about four weeks to      assess that.   Thank you very much for allowing [place] to participate in her care.  Please  do not hesitate to contact me.    Sincerely,      Duke Salvia, MD, [facility]  Electronically Signed    SCK/MedQ  DD: 01/28/2008  DT: 01/29/2008  Job #: [id number]

## 2011-05-02 NOTE — Assessment & Plan Note (Signed)
[facility]                         ELECTROPHYSIOLOGY OFFICE NOTE   JASMAIN, AHLBERG                      MRN:          [id number]  DATE:01/14/2009                            DOB:          1919/03/04    [name] [name] is seen in followup because of arrhythmias identified as part  of remote followup for her ICD.  She has had no significant symptoms and  has had no ICD discharges.  Her functional status has remained stable.   MEDICATIONS:  1. Amiodarone 200 a day.  2. Amlodipine.  3. Simvastatin.  4. Levothyroxine.   PHYSICAL EXAMINATION:  VITAL SIGNS:  Her blood pressure today was 142/80  with a pulse of 84 with some orthostatic dizziness, but no orthostatic  hypotension except for transient drop from sitting to standing.  She  also had systolic supine hypertension of 168/91.  LUNGS:  Clear.  HEART:  Sounds were regular.  NECK:  The carotids were brisk and full without bruits.  ABDOMEN:  Soft.  EXTREMITIES:  No edema.   Interrogation of her St. Jude ICD demonstrated multiple episodes, all of  which have electrograms consistent with and were interpreted by the  device as SVT.  The cycle length was approximately 430 milliseconds.  We  reprogrammed the device to a 2-zone device at 185/230, and we began her  on carvedilol 3.125 twice daily as augmented rate control.   I mentioned to her that I anticipate Dr. Renae Gloss is following her  amiodarone surveillance labs easily, her liver, and her thyroid.     Duke Salvia, MD, [facility]  Electronically Signed    SCK/MedQ  DD: 01/14/2009  DT: 01/15/2009  Job #: [id number]   cc:   Merlene Laughter. Renae Gloss, M.D.

## 2011-05-02 NOTE — Consult Note (Signed)
NAME:  [name] [name], [name] [name]               ACCOUNT NO.:  [id number]   MEDICAL RECORD NO.:  [id number]          PATIENT TYPE:  EMS   LOCATION:  ED                           FACILITY:  [facility]   PHYSICIAN:  Stefani Dama, M.D.  DATE OF BIRTH:  [birth year]   DATE OF CONSULTATION:  05/09/2009  DATE OF DISCHARGE:                                 CONSULTATION   REASON FOR REQUEST:  Type 3 odontoid fracture.   HISTORY OF PRESENT ILLNESS:  [name] [name] is an 77 year old woman who a  week ago had sustained a fall.  She complained of neck pain and diffuse  aching, but was complaining mostly of lacerations and contusion about  her right frontal forehead.  Today, when she came back for suture  removal, she complained bitterly that she was having substantial neck  pain.  The doctor at the Urgent Care ordered a C-spine series and  ultimately a CT of the C-spine, which demonstrated a type 3 odontoid  fracture with minimal displacement.  She is seen now for further  consultation regarding this injury.  The patient gives a history of some  occasional falling episodes because of some hypotension that she has  experienced.  She is followed by Dr. Graciela Husbands.  He is her cardiologist and  she does have an implanted pacemaker and automatic defibrillator.  Her  motor function has been and remains normal.  She denies any numbness or  tingling in the upper extremities.  She denies any double vision,  ringing in the ears, or any other lower cranial nerve phenomenon.   PHYSICAL EXAMINATION:  She is awake, alert, oriented, individual.  Her  neck is stiff posteriorly particularly with some spasm in the trapezii  bilaterally.  Range of motion is severely limited allowing herself to  turn only 15 degrees to left than to the right.  She has otherwise been  placed in a hard cervical collar and motor function is normal in the  upper extremities and the deltoids, biceps, grips and intrinsics.  Sensation is intact distally in the  upper extremities bilaterally.   IMPRESSION:  The patient has evidence of a type 3 odontoid fracture with  minimal displacement.  At her age, this fracture will not likely heal,  but hopefully, it will fibrose in place.  I discussed with her the  treatment, which would involve using a hard cervical collar such as a  [place] J or an Aspen type collar for immobilization.  She has just been  placed in a [place] J type collar and she will remain in this for the next  8 weeks or so.  I have advised that we should have her come back to the  office in approximately 4 weeks for x-rays with flexion/extension views.  In the meantime, I have given her prescription for Darvocet-N 100, #40,  without refills.  She will be seen as an outpatient.      Stefani Dama, M.D.  Electronically Signed     HJE/MEDQ  D:  05/09/2009  T:  05/10/2009  Job:  [id number]

## 2011-05-02 NOTE — Letter (Signed)
August 19, 2008    Andi Devon, M.D.  [address]  [place], [place] [address]   RE:  [name] [name], [name] [name]  MRN:  [id number]  /  DOB:  [birth year]   Dear Selena Batten,   Ms. Schmutz was in today continued to complaint of dizziness.  Orthostatics today are recorded below, but are negative.  She says that  since she was put on the ProAmatine that she has had more problems with  dizziness as well as nocturnal polyuria.  She has also had more problems  with dizziness.   As I mentioned, orthostatics today were unrevealing with a blood  pressure of 115/60 with pulse of 49 without change with orthostatics  pressure.  Lungs were clear.  Heart sounds were regular.  Extremities  were without edema.   I should note that her resting blood pressure on arrival was 98/60.   Interrogation of her defibrillator demonstrates recurrent episodes of  the supraventricular tachycardia, which appears to be a sinus  tachycardia.   My thought was that we should go ahead and discontinue her metoprolol at  this point.   We will see her again in 1 year's time and continue to follow her and  monitor remotely in the interim.    Sincerely,      Duke Salvia, MD, [facility]  Electronically Signed    SCK/MedQ  DD: 08/19/2008  DT: 08/20/2008  Job #: [id number]

## 2011-05-05 ENCOUNTER — Inpatient Hospital Stay (HOSPITAL_COMMUNITY)
Admission: EM | Admit: 2011-05-05 | Discharge: 2011-05-13 | DRG: 100 | Disposition: A | Discharge status: Skilled Nursing Facility | Payer: Medicare Other | Attending: Internal Medicine | Admitting: Internal Medicine

## 2011-05-05 ENCOUNTER — Emergency Department (HOSPITAL_COMMUNITY): Payer: Medicare Other

## 2011-05-05 ENCOUNTER — Encounter (HOSPITAL_COMMUNITY): Payer: Self-pay | Admitting: Radiology

## 2011-05-05 DIAGNOSIS — R0902 Hypoxemia: Secondary | ICD-10-CM | POA: Diagnosis present

## 2011-05-05 DIAGNOSIS — N390 Urinary tract infection, site not specified: Secondary | ICD-10-CM | POA: Diagnosis present

## 2011-05-05 DIAGNOSIS — G40802 Other epilepsy, not intractable, without status epilepticus: Principal | ICD-10-CM | POA: Diagnosis present

## 2011-05-05 DIAGNOSIS — J9600 Acute respiratory failure, unspecified whether with hypoxia or hypercapnia: Secondary | ICD-10-CM | POA: Diagnosis present

## 2011-05-05 DIAGNOSIS — E876 Hypokalemia: Secondary | ICD-10-CM | POA: Diagnosis present

## 2011-05-05 DIAGNOSIS — F05 Delirium due to known physiological condition: Secondary | ICD-10-CM | POA: Diagnosis present

## 2011-05-05 DIAGNOSIS — E039 Hypothyroidism, unspecified: Secondary | ICD-10-CM | POA: Diagnosis present

## 2011-05-05 DIAGNOSIS — I472 Ventricular tachycardia, unspecified: Secondary | ICD-10-CM | POA: Diagnosis present

## 2011-05-05 DIAGNOSIS — G92 Toxic encephalopathy: Secondary | ICD-10-CM | POA: Diagnosis present

## 2011-05-05 DIAGNOSIS — E785 Hyperlipidemia, unspecified: Secondary | ICD-10-CM | POA: Diagnosis present

## 2011-05-05 DIAGNOSIS — Z7982 Long term (current) use of aspirin: Secondary | ICD-10-CM

## 2011-05-05 DIAGNOSIS — E46 Unspecified protein-calorie malnutrition: Secondary | ICD-10-CM | POA: Diagnosis present

## 2011-05-05 DIAGNOSIS — I4729 Other ventricular tachycardia: Secondary | ICD-10-CM | POA: Diagnosis present

## 2011-05-05 DIAGNOSIS — G929 Unspecified toxic encephalopathy: Secondary | ICD-10-CM | POA: Diagnosis present

## 2011-05-05 DIAGNOSIS — I951 Orthostatic hypotension: Secondary | ICD-10-CM | POA: Diagnosis present

## 2011-05-05 DIAGNOSIS — Z95810 Presence of automatic (implantable) cardiac defibrillator: Secondary | ICD-10-CM

## 2011-05-05 DIAGNOSIS — F0390 Unspecified dementia without behavioral disturbance: Secondary | ICD-10-CM | POA: Diagnosis present

## 2011-05-05 DIAGNOSIS — I10 Essential (primary) hypertension: Secondary | ICD-10-CM | POA: Diagnosis present

## 2011-05-05 DIAGNOSIS — E7800 Pure hypercholesterolemia, unspecified: Secondary | ICD-10-CM | POA: Diagnosis present

## 2011-05-05 DIAGNOSIS — I252 Old myocardial infarction: Secondary | ICD-10-CM

## 2011-05-05 HISTORY — DX: Acute myocardial infarction, unspecified: I21.9

## 2011-05-05 HISTORY — DX: Essential (primary) hypertension: I10

## 2011-05-05 LAB — COMPREHENSIVE METABOLIC PANEL
AST: 43 U/L — ABNORMAL HIGH (ref 0–37)
Albumin: 3.2 g/dL — ABNORMAL LOW (ref 3.5–5.2)
Alkaline Phosphatase: 82 U/L (ref 39–117)
Chloride: 100 mEq/L (ref 96–112)
Creatinine, Ser: 0.81 mg/dL (ref 0.4–1.2)
GFR calc Af Amer: >60 mL/min (ref >60)
Potassium: 3.9 mEq/L (ref 3.5–5.1)
Total Bilirubin: 0.3 mg/dL (ref 0.3–1.2)
Total Protein: 7.6 g/dL (ref 6.0–8.3)

## 2011-05-05 LAB — CSF CELL COUNT WITH DIFFERENTIAL
RBC Count, CSF: 197 /mm3 — ABNORMAL HIGH
Tube #: 4
WBC, CSF: 1 /mm3 (ref 0–5)

## 2011-05-05 LAB — URINALYSIS, ROUTINE W REFLEX MICROSCOPIC
Bilirubin Urine: NEGATIVE
Glucose, UA: NEGATIVE mg/dL
Specific Gravity, Urine: 1.014 (ref 1.005–1.030)
Urobilinogen, UA: 0.2 mg/dL (ref 0.0–1.0)

## 2011-05-05 LAB — GRAM STAIN

## 2011-05-05 LAB — CBC
MCH: 30.4 pg (ref 26.0–34.0)
MCHC: 34.1 g/dL (ref 30.0–36.0)
RDW: 14.3 % (ref 11.5–15.5)

## 2011-05-05 LAB — PROTIME-INR
INR: 0.91 (ref 0.00–1.49)
Prothrombin Time: 12.5 seconds (ref 11.6–15.2)

## 2011-05-05 LAB — URINE MICROSCOPIC-ADD ON

## 2011-05-05 LAB — DIFFERENTIAL
Basophils Absolute: 0 10*3/uL (ref 0.0–0.1)
Basophils Relative: 0 % (ref 0–1)
Eosinophils Absolute: 0.1 10*3/uL (ref 0.0–0.7)
Eosinophils Relative: 1 % (ref 0–5)
Monocytes Absolute: 0.7 10*3/uL (ref 0.1–1.0)
Monocytes Relative: 5 % (ref 3–12)

## 2011-05-05 LAB — TROPONIN I: Troponin I: <0.3 ng/mL (ref <0.30)

## 2011-05-05 LAB — CK TOTAL AND CKMB (NOT AT ARMC): Relative Index: 2.7 — ABNORMAL HIGH (ref 0.0–2.5)

## 2011-05-05 NOTE — Assessment & Plan Note (Signed)
[facility]                         ELECTROPHYSIOLOGY OFFICE NOTE   XOE, HOE                      MRN:          [id number]  DATE:01/10/2007                            DOB:          1919-03-29    Ms. Hedding is seen today.  She is status post ICD implantation for  polymorphic VT in the setting of ischemic heart disease with normal left  ventricular function.  She has chronic dizziness unrelated to  amiodarone.  She has had no complaints.   Her medications are reviewed.   EXAMINATION:  Her blood pressure is 139/82 with a pulse of 68.  LUNGS:  Clear.  HEART SOUNDS:  Regular.   Interrogation of her St. Jude Atlas [phone] demonstrated an R wave of 12  with an impedance of 385, a threshold of 1.25 and battery voltage of  3.2.  There were 15 episodes of SVT.   I have reprogrammed her device to a three zone device to create anti-  tachycardia pacing in zone 1.  We will see her again in 1 year.  She  will be followed via house call in the interim.     Duke Salvia, MD, [facility]  Electronically Signed    SCK/MedQ  DD: 01/10/2007  DT: 01/10/2007  Job #: [id number]   cc:   Olene Craven, M.D.

## 2011-05-05 NOTE — Discharge Summary (Signed)
NAMEANNEL, [name] [name]               ACCOUNT NO.:  [id number]   MEDICAL RECORD NO.:  [id number]          PATIENT TYPE:  INP   LOCATION:  6531                         FACILITY:  [facility]   PHYSICIAN:  Duke Salvia, M.D.  DATE OF BIRTH:  Nov 03, 1919   DATE OF ADMISSION:  12/15/2005  DATE OF DISCHARGE:  12/16/2005                                 DISCHARGE SUMMARY   PRINCIPAL DIAGNOSIS:  Syncope with history of polymorphic ventricular  tachycardia.   OTHER DIAGNOSES:  1.  Dizziness secondary to amiodarone and Toprol.  2.  Hyperlipidemia.  3.  Hypothyroidism.  4.  Osteoporosis.  5.  Osteoarthritis.  6.  Spinal fracture in the distant past secondary to trauma.  7.  History of heart murmur.  8.  Medically managed 2 vessel coronary artery disease.   ALLERGIES:  FLEXERIL, BENADRYL, DRAMAMINE (all causing mental status changes  in the past).   PROCEDURE:  Successful placement of a St. Jude Atlas II VR single lead AICD.   HISTORY OF PRESENT ILLNESS:  An 77 year old female with history as outlined  in principle and secondary diagnoses, who was admitted to [facility] October 30, 2005 following syncope with polymorphic ventricular  tachycardia and non-ST elevation MI. During that hospitalization, she  underwent cardiac catheterization revealing 2 vessel coronary disease, which  has been medically managed. On discharge, she was initiated on beta blocker  and amiodarone therapy, which has unfortunately caused her to experience  intolerable dizziness. She has subsequently followed up with Dr. Graciela Husbands and  had been taken off of her Toprol with continued symptoms and decision was  made to pursue placement of ICD and discontinuation of amiodarone.   HOSPITAL COURSE:  The patient presented to the EP lab on December 15, 2005  and underwent successful placement of a St. Jude Atlas II VR single lead  AICD. Defibrillation threshold testing was not performed secondary to non-  revascularized  coronary disease. She did have a small stable pocket hematoma  and she was felt satisfactory for discharge this morning. Her chest x-ray  shows no complications or evidence of pneumothorax.   LABORATORY DATA:  On discharge hemoglobin 13.2, hematocrit 39.2. WBC 10.9.  Platelets 399,000. Sodium 139, potassium 4.1, chloride 103, CO2 27, BUN 14,  creatinine 1.2, glucose 88, calcium 9.2.   DISPOSITION:  The patient is being discharged home today in good condition.   FOLLOW UP:  She will be contacted by [facility] for followup with  Dr. Graciela Husbands in approximately 3 months and with our ICD clinic in approximately  2 weeks.   DISCHARGE MEDICATIONS:  1.  Vytorin as previously prescribed.  2.  Plavix 75 mg daily.  3.  Enteric coated aspirin 325 mg daily.  4.  Synthroid 100 mcg daily.  5.  Quinine sulfate 325 mg daily for leg cramping.   OUTSTANDING LAB STUDIES:  None.   DURATION OF DISCHARGE ENCOUNTER:  40 minutes including physician time.      Ok Anis, NP    ______________________________  Duke Salvia, M.D.    CRB/MEDQ  D:  12/16/2005  T:  12/17/2005  Job:  [id number]   cc:   Olene Craven, M.D.  Fax: [phone]   Willa Rough, M.D.  [address]  [address]  [place]  [place] [address]   Duke Salvia, M.D.  [address]  [address]  [place]  [place] [address]

## 2011-05-05 NOTE — H&P (Signed)
[name] [name], [name] [name]               ACCOUNT NO.:  [id number]   MEDICAL RECORD NO.:  [id number]          PATIENT TYPE:  INP   LOCATION:  2301                         FACILITY:  [facility]   PHYSICIAN:  Marrian Salvage. Freida Busman, M.D. [facility] OF BIRTH:  [birth year]   DATE OF ADMISSION:  10/29/2005  DATE OF DISCHARGE:                                HISTORY & PHYSICAL   CHIEF COMPLAINT:  Syncope with VT storm.   PRIMARY CARE PHYSICIAN:  Olene Craven, M.D. in [place].   CARDIOLOGIST:  None.   ADMITTING PHYSICIAN:  Willa Rough, M.D.   HISTORY OF PRESENT ILLNESS:  The patient is an 77 year old female with  minimal past medical history and no known heart disease.  She is highly  functional and incredibly active, as demonstrated by the fact that she uses  a push mower 2 times a week to take care of her yard and does extensive  gardening.  She was in her usual state of health all week.  She worked in  her yard; however, she did report that she has not been sleeping well.  Otherwise, review of systems per the family was negative.  She went to her  family's house for dinner at noontime today, October 29, 2005.  While  sitting down getting ready to eat at approximately 1:30 p.m., she suddenly  passed out in her chair.  Her head went back, and her eyes rolled back into  her head.  She was out for approximately 30 seconds.  One of the family  members who was a nurse tried to take her carotid pulse and could not get a  pulse.  They shook her.  Eventually, she woke up.  Initially, she was mildly  somnolent, but by 5 minutes after the episode she was completely back to  normal.  Emergency medical services was called.  On arrival, her blood  pressure was 180/80, which was high for her, but otherwise there were no  abnormalities.  She was taken to [facility].   At [facility], EKG was normal.  Initial cardiac markers were negative  by point of care testing.  Consequently, they were getting  ready to  discharge her home, and then around 6:30 p.m. she passed out again briefly  while she was off the monitor.  She regained consciousness very rapidly on  her own.  Because of this, they put her back on telemetry.  At 6:40 p.m.,  the patient again passed out.  Telemetry at that time showed polymorphic  ventricular tachycardia with a rate of greater than 200.  The staff quickly  responded and delivered a single shock, which put her back into normal sinus  rhythm.  Around 7:30, she had another episode of polymorphic VT, which  spontaneously resolved.  She was started on amiodarone 300 mg and then 1 mg  per minute.  Arrangements were made to transfer the patient to [facility].   In getting ready to transfer, the patient was complaining of some leg  cramps.  She also had some soreness after shock therapy.  She was given some  Ativan and became relatively agitated.  She was then given some Benadryl  with markedly worse confusion at that time.  Some additional medications may  have been given.   On arrival to [facility] around 10 p.m., her vital signs were  stable, but she was extremely agitated.  She was not following commands.  She was combative and had good strength in all extremities with no evidence  of focal neurologic defects.  Unfortunately, due to her combative nature,  peripheral access was lost.  She then had a right IJ triple lumen catheter  placed without complications with staff helping to hold the patient in  place.  Chest x-ray confirmed that it was at the time of the right atrium.  She was then continued on her amiodarone.  She had also been started on  heparin and Integrilin just prior to transfer.  She had some rhonchi  throughout on physical examination.  Otherwise, heart was normal without  gallop or murmur.  She was given Lasix 40 IV and put out 1500 cc relatively  rapidly.  She was also given metoprolol 10 mg IV.  She then began to have   bradycardia into the 50s.  She also dropped her blood pressure down into the  80s/30s.  Between 11:30 p.m. and 1:30 a.m., she then proceeded to have 8  episodes of polymorphic VT requiring shock with 150 joules.  Each time, she  easily responded to therapy.  Laboratories showed a potassium of 3.6 at the  outside hospital, and potassium of 3.2 here, so that was repleted.  She was  also given empiric magnesium.  Her amiodarone was re-bolused times another  150 mg and was continued.  She was then later given a lidocaine 65 mg bolus,  and then started at a drip of 1 mg per hour.  These interventions did not  have immediate benefit.  Finally, because of her increasing somnolence and  repeated shocks, a decision was made to electively intubate her.  The  anesthesia team did so with some sedation and paralytic.  Soon after  intubation, her blood pressure improved transiently, and her heart rate came  up into the 70s.   Interestingly, the patient's EKG was basically normal with no significant Q  waves, no ST depression or elevation, and no T wave inversion.  Furthermore,  her QTC was approximately 400 without significant prolongment.  Furthermore,  her cardiac markers have only been mildly positive.  Here, her troponin was  0.17 with an MB of 17.  At the outside hospital, her markers were mildly  positive, as well.  She also has never had any chest pain while she was  lucid.  Echocardiogram was performed at the bedside by me, which showed very  good sound transmission with clearly normal left ventricular systolic  function with an ejection fraction of 60%.  The ventricular dimensions were  normal.  The aortic valve was mildly thickened, but there was no obvious AI  or stenosis.  There was trace mitral regurgitation.  Right ventricular  function appearing grossly normal.  Dr. Myrtis Ser and I spoke about the case and  decided that despite the fact that polymorphic VT is often driven by ischemia, there  was no evidence in this case that the patient was having  ventricular ischemia.  Consequently, we are continuing medical management.  Dr. Lewayne Bunting will be involved in her care from an EP prospective.   PAST MEDICAL HISTORY:  1.  Hypothyroidism.  2.  Hyperlipidemia with total cholesterol of 258.  3.  Osteoporosis.  4.  Arthritis.  5.  Spinal fracture in the distant past, status post trauma.  6.  History of heart murmur.   ALLERGIES:  1.  FLEXERIL.  2.  BENADRYL.  3.  DRAMAMINE.  (All have caused severe change in mental status in the past.)   CURRENT MEDICATIONS:  1.  Zocor 40 mg daily, which was supposed to be changed to a more potent      medication, but had not been done yet.  2.  Synthroid, unclear dose.  3.  Centrum multivitamin.  4.  Aspirin 81 mg daily.  5.  Fosamax was prescribed, but she has not been taking it.   SOCIAL HISTORY:  The patient lives in [place] alone.  She has one son who  lives in [place], whom I spoke with.  His name is Duha Abair, phone number  [phone].  She has neighbors and a close friend named Joyce Gross who is also  involved in her care.  She has never smoked tobacco.  She has never drank  alcohol.  She has never had a cup of coffee.  She is extremely active, as  mentioned above.   FAMILY HISTORY:  Father died in his 54s of heart disease.  Mother died at  age 62 of a stroke.  She has some siblings with coronary disease.   REVIEW OF SYSTEMS:  Per the family, is it positive for palpitations and  syncope and some difficulty sleeping, but otherwise was negative in detail.   ADVANCE DIRECTIVES:  The patient has a living will.  Her living will says  that she does not want prolonged life support, but that she is willing to  have aggressive interventions over the short term if they are likely to  bring about significant improvement in her health.   PHYSICAL EXAMINATION:  VITAL SIGNS:  Temperature 98, pulse anywhere from 140  to 50, respirations 24,  blood pressure maximum was 182/110 with agitation,  but has dropped down to a nadir of 65/25.  Saturation was initially 96% on  100% face mask, but she dropped as low as 84% on face mask.  GENERAL:  She was agitated and confused and then became increasingly  somnolent.  She had no obvious trauma or bruising.  NECK:  Supple.  She had no carotid bruits.  JVP was 6 cm, and she had  minimal venous flow with triple lumen catheter placement.  HEART:  Regular rate and rhythm with normal S1 and S2.  A 1/6 murmur at the  right upper sternal border.  LUNGS:  Fairly significant crackles throughout.  SKIN:  No rash.  ABDOMEN:  Soft, nontender.  EXTREMITIES:  No significant peripheral edema and were relatively warm.  NEUROLOGIC:  She was thrashing and moving all 4 extremities with excellent  motor control.  Pupils were 2.5 mm bilaterally.   CHEST X-RAY:  Chest x-ray was reviewed and showed mild CHF with a line in  the right atrium.  EKG:  Normal sinus rhythm at 70 with normal axis, QRS of 80, QTC of 400.  No  Q waves, no ST-T changes, and no hypertrophy.  There was no prior  comparison.   LABORATORY DATA:  White blood cell count of 9, hematocrit 40, platelets 262.  Sodium 140, potassium 3.6, bicarbonate 28, BUN 18, creatinine 1.1, glucose  100.  ALT of 46, AST of 63, albumin of 3.9.  CK of 400, MB 17, troponin 0.02  at the outside hospital, and then 0.17 here.  Coags were normal.  Urinalysis  was negative.   ECHOCARDIOGRAM:  Echocardiogram at bedside was basically normal, as reported  above, with normal wall motion.   ASSESSMENT AND PLAN:  An 77 year old female with no prior heart disease in  excellent health who presents with unheralded syncope due to ventricular  tachycardia storm with polymorphic ventricular tachycardia.  1.  Ventricular tachycardia.  Clearly polymorphic.  Unclear etiology.  Her      QTC is normal.  The etiology for this is almost always ischemia, but      there is no  strong evidence that she is having coronary occlusion.  Wall      motion is normal by echocardiogram, and EKG shows no significant      abnormalities.  Her troponins have only been mildly elevated, even 11      hours after the onset of her ventricular tachycardia.  Her potassium was      mildly depressed, and a magnesium is pending, but these do not seem      sufficient in and of themselves to cause this.  She has not been on      medications which would predispose her to this.  She does not have a      family history of sudden cardiac death, and she herself has never had      syncope before.  Consequently, will continue to treat her with      amiodarone and lidocaine.  Will hold the beta blocker for now, as the      bradycardia seemed to make this worse.  We could consider overdrive      pacing.  Will aggressively replete magnesium and potassium  2.  A non-ST elevation myocardial infarction.  This seems more likely      related to demand than a primary event.  She has no ST elevations.  At      this point, will treat her with aspirin, Integrilin, and heparin.  Hold      off on Plavix for now.  She is already on statin, and will give her a      beta blocker if she is not particularly bradycardic.  She will likely      need a catheterization prior to discharge, given the ventricular      tachycardia; however, I think catheterization may be delayed until she      is more stable.  Additionally, there is no evidence of ischemia, so an      intra-aortic balloon pump would not be indicated at this time.  3.  Change in mental status, almost certainly related to medications, as      they coincided with administration of Ativan and Benadryl, for which she      has had problems in the past.  It was pre-heparin, and she had no      focality on exam, so I did not feel a head CT was indicated at this      time.  She has now been intubated for airway protection, as well as for      comfort, given  frequent shocks. 4.  Thyroid disease.  Will check a TSH and a free T4.  Will need to get her      home medications.  In the meantime, will give her a thyroid supplement      intravenous at 50 mcg a day.  5.  Prophylaxis.  Prevacid and  heparin.  '.   DIET:  NPO for now, but needs to be addressed in the near future.   CODE STATUS:  No prolonged life support.   DISPOSITION:  Critical care with likely expected prolonged hospitalization.           ______________________________  Marrian Salvage Freida Busman, M.D. [facility]     LAA/MEDQ  D:  10/30/2005  T:  10/30/2005  Job:  [id number]

## 2011-05-05 NOTE — Discharge Summary (Signed)
[name] [name], [name] [name]               ACCOUNT NO.:  [id number]   MEDICAL RECORD NO.:  [id number]          PATIENT TYPE:  INP   LOCATION:  2037                         FACILITY:  [facility]   PHYSICIAN:  Doylene Canning. Ladona Ridgel, M.D.  DATE OF BIRTH:  [birth year]   DATE OF ADMISSION:  10/29/2005  DATE OF DISCHARGE:  11/09/2005                                 DISCHARGE SUMMARY   DISCHARGE DIAGNOSIS:  1.  Ventricular tachycardia storm (polymorphic ventricular tachycardia      requiring ventilator dependent respiratory assistance).  The patient was      status post multiple shocks.  2.  Two vessel coronary artery disease status post cardiac catheterization      on October 30, 2005, by Dr. Antoine Poche with disease of the right coronary      artery which is not amenable to percutaneous revascularization, disease      to the left anterior descending  with an ejection fraction of 55-65%,      medical therapy, the patient is not a candidate for CVTS, status post      non-ST elevated myocardial infarction.  3.  Hypothyroidism, TSH 6.25, questionable secondary to Amiodarone therapy.   PAST MEDICAL HISTORY:  Hypothyroidism, hyperlipidemia, osteoporosis,  arthritis, spinal fracture, history of heart murmur.   ALLERGIES:  Flexeril, Benadryl, Dramamine.   PROCEDURES THIS ADMISSION:  Cardiac catheterization with results as noted  above.   CONSULTATIONS:  Kerin Perna, M.D., CVTS, on November 03, 2005.   HOSPITAL COURSE:  [name] [name] is an 77 year old female with no known heart  disease who presented this admission with syncopal episode transported to  [facility] from [facility].  At [facility], the  patient had a normal EKG with negative cardiac markers.  The patient was  discharged home when she had a repeat syncopal episode.  Telemetry showed  polymorphic V-tach at a rate of greater than 200.  The patient was shocked  back into normal sinus rhythm.  Repeat episode again.  The patient  was  started on Amiodarone and transferred to [facility] for further  evaluation.  Upon arrival to [facility], the patient began to have  bradycardic rhythm in the 50s with an episode of hypotension with repeat  episodes of V-tach.  The patient was intubated.  Bedside echocardiogram  showing normal EF and normal left ventricular systolic function.  EP was  asked to consult.  In the meantime, blood work showing a troponin of 0.17.  The patient ruled in for non-ST elevated MI.  Arranged for cardiac  catheterization to rule out ischemic causes of V-tach.  Patient to the cath  lab, results as stated above.  The patient tolerated the procedure well  without complications.  Medications adjusted by EP physicians.  No further  episodes of V-tach, however, the patient experienced dizziness secondary to  Amiodarone, however, dizziness seem to improve.  Dr. Ladona Ridgel in to see the  patient prior to discharge.  Medication doses adjusted.  The patient is to  follow up with Dr. Graciela Husbands.   DISCHARGE MEDICATIONS:  Plavix 75 mg daily,  Synthroid 0.1 mg daily, Toprol  XL 50 mg daily, aspirin 325 mg daily, Zocor 40 mg daily, Amiodarone 200 mg  p.o. t.i.d. x 2 weeks then 200 mg p.o. b.i.d. until follow up with Dr.  Graciela Husbands.   DISCHARGE INSTRUCTIONS:  The patient is to call Dr. Graciela Husbands and schedule an  appointment for three months.  In the meantime, no driving for two months.  Increase activity slowly.      Dorian Pod, NP    ______________________________  Doylene Canning. Ladona Ridgel, M.D.    MB/MEDQ  D:  12/25/2005  T:  12/25/2005  Job:  [id number]   cc:   Olene Craven, M.D.  Fax: [phone]   Duke Salvia, M.D.  [address]  [address]  [place]  [place] [address]

## 2011-05-05 NOTE — Letter (Signed)
October 30, 2006    Olene Craven, M.D.  [address]  [address]  [place], [place] [address]   RE:  [name], [name]  MRN:  [id number]  /  DOB:  May 12, 1919   Dear Roe Coombs,   Ms. Filippone comes in today for her device followup. She continues to complain  of dizziness which remains orthostatic. It happens primarily in the morning  and by mid morning it has abrogated. It was no different with the  discontinuation of her amiodarone.   She has suffered 2 intercurrent shocks, both of which are for  supraventricular tachycardia which is atrial in origin.   She denies complaints of chest pain or shortness of breath.   Her medication regime is unchanged except for intercurrent addition of  Toprol 25 mg a day as well as the aspirin 81.   PHYSICAL EXAMINATION:  VITAL SIGNS:  Blood pressure 142/78, pulse 76.  LUNGS:  Clear.  HEART:  Sounds were regular.  EXTREMITIES:  Without edema.   Interrogation of her St. Jude Atlas [phone] ICD demonstrated an R wave of 12  with impedance of 420, a threshold of 0.75 at 0.4. There was an episode of  August 11 and there was also a second episode November 7. There are no  electrograms for the latter. The electrograms for the former represent a  supraventricular tachycardia.   IMPRESSION:  1. Polymorphic ventricular tachycardia.  2. Ischemic heart disease with normal left ventricular function.  3. Dizziness probably orthostatic but not evidently affected by her      amiodarone.  4. Recurrent atrial tachycardia associated with inappropriate ICD therapy.   Don, I have taken the liberty of starting her back on her amiodarone at 200  mg a day. We have gotten LFTs and TSH today as baseline functions. I will  see her again in 4-6 weeks to see if we are having any impact on her  tachycardia.   Roe Coombs, thanks as always for letting me participate in the care of your  patient.    Sincerely,      Duke Salvia, MD, [facility]  Electronically Signed    SCK/MedQ   DD: 10/30/2006  DT: 10/30/2006  Job #: [id number]

## 2011-05-05 NOTE — Consult Note (Signed)
NAMEBUFFI, EWTON               ACCOUNT NO.:  [id number]   MEDICAL RECORD NO.:  [id number]          PATIENT TYPE:  INP   LOCATION:  2925                         FACILITY:  [facility]   PHYSICIAN:  Kerin Perna, M.D.  DATE OF BIRTH:  Jul 12, 1919   DATE OF CONSULTATION:  11/03/2005  DATE OF DISCHARGE:                                   CONSULTATION   PHYSICIAN REQUESTING CONSULTATION:  Duke Salvia, M.D.   PRIMARY CARDIOLOGIST:  Willa Rough, M.D.   REASON FOR CONSULTATION:  Severe two-vessel coronary disease, aortic  stenosis, ventricular tachycardia.   CHIEF COMPLAINT:  Syncope.   HISTORY OF PRESENT ILLNESS:  I was asked to evaluate this 77 year old white  female for potential surgical intervention for coronary disease and aortic  stenosis which were recently diagnosed by cardiac cath and 2-D  echocardiogram. The patient presented to the hospital on October 30, 2005  after having sudden syncope and was found to be in polymorphic ventricular  tachycardia. She is transferred to [facility] from [facility] where she received multiple DC cardioversions and chest  compression. She required intubation for pulmonary edema. Her cardiac  enzymes returned moderately elevated with CPK-MB of 20 ng/mL. A 2-D  echocardiogram showed a fairly well preserved LV function with calcified  aortic valve. The patient underwent cardiac catheterization with coronary  arteriography by Dr. Antoine Poche which demonstrated a chronically occluded  right with left-to-right collaterals. The LAD had an 80% stenosis but it was  a small vessel. The circumflex had no significant disease.   A second repeat 2-D echocardiogram after she was extubated was of better  quality and showed evidence of moderate to severe aortic stenosis with a  peak gradient of 37 mmHg and valve area 1.0 with calcified restrictive  leaflets. Since being placed on amiodarone, she has had no ventricular  tachycardia. Because  of her coronary anatomy and valvular disease, she was  felt to be a potential candidate for surgical revascularization and aortic  valve replacement.   PAST MEDICAL HISTORY:  1.  Degenerative arthritis.  2.  Hyperlipidemia.  3.  Hypothyroidism.  4.  Long history of heart murmur.   ALLERGIES:  BENADRYL and FLEXERIL.   CURRENT MEDICATIONS:  Amiodarone, Zocor, Synthroid, aspirin and Lovenox.   SOCIAL HISTORY:  The patient is single. She lives independently. She has  never smoked or had alcohol. Her mother died in her 83s of a stroke and  father died in his 80s of heart disease.   REVIEW OF SYSTEMS:  No significant change in weight or fever. ENT review is  positive for decreasing vision and poor dentition with some necrotic lower  teeth GI review is negative for change in bowel habits or blood per rectum.  Vascular review is negative for claudication or stroke. Her carotid Doppler  exam shows no significant carotid artery disease.   PHYSICAL EXAM:  The patient is 5 feet tall and weighs 100 pounds, blood  pressure is 140/70, pulse is 70, saturation 98% on 2 liters. Generally, she  is alert and coherent and appropriate. HEENT exam reveals  necrotic  mandibular teeth. Neck is without JVD, mass or bruit. Lymphatics are  negative. Thorax is without deformity. She has scattered basilar rales.  Cardiac exam reveals a 3/6 systolic ejection murmur with aortic stenosis.  Her abdominal exam is soft without pulsatile mass. Extremities reveal no  clubbing but there is some arthritic deformity and mild pedal edema.  Peripheral pulses are trace. Neurologic exam is nonfocal.   LABORATORY DATA:  Her cardiac cath shows a chronically occluded right  coronary with an 80% stenosis of the LAD. Her 2-D echocardiogram shows  moderate aortic stenosis and LVH with fairly well preserved LV systolic  function. Her CT scan of the chest shows evidence of basilar air space  disease, possible aspiration when she  was intubated emergently. Her white  count is 10,000, creatinine 1.0, hematocrit 32%.   IMPRESSION/RECOMMENDATIONS:  The patient has significant coronary disease  and moderate aortic valvular disease. Her ventricular tachycardia has been  well-controlled on medications. Rather than subject this lady to very high-  risk coronary artery bypass graft and valve replacement from which she has  not been symptomatic up until this point, I would recommend controlling her  heart disease with amiodarone and beta blocker. I have discussed this  recommendation with the patient and she agrees.      Kerin Perna, M.D.  Electronically Signed     PV/MEDQ  D:  11/03/2005  T:  11/04/2005  Job:  [id number]

## 2011-05-05 NOTE — Cardiovascular Report (Signed)
[name] [name], ROSSA               ACCOUNT NO.:  [id number]   MEDICAL RECORD NO.:  [id number]          PATIENT TYPE:  INP   LOCATION:  2301                         FACILITY:  [facility]   PHYSICIAN:  Rollene Rotunda, M.D.   DATE OF BIRTH:  [birth year]   DATE OF PROCEDURE:  10/30/2005  DATE OF DISCHARGE:                              CARDIAC CATHETERIZATION   PRIMARY CARE PHYSICIAN:  Olene Craven, M.D.   REASON FOR PRESENTATION:  Evaluate patient with incessant polymorphic  ventricular tachycardia consistent with ischemia.   PROCEDURAL NOTE:  Left heart catheterization performed in the right femoral  artery. The artery was cannulated using an anterior wall puncture. A #6  French arterial sheath was inserted via the modified Seldinger technique.  The patient did have a tortuous aorta with heavy calcification.  Catheters  were exchanged over a wire to avoid vascular trauma to minimize the dye and  avoid trauma I avoided an LV-gram.  The patient was intubated and sedated  throughout.  She did become agitated and did move her right leg with the  vascular sheath in place, sedation was increased.  She left the lab  hemodynamically stable with the blood pressure of 107/61 but a small right  groin hematoma.   RESULTS/HEMODYNAMICS:  LV not crossed.  AO 139/74.   CORONARIES:  1.  The left main had ostial 50% stenosis.  2.  The LAD was somewhat small and did not wrap the apex.  There was a      proximal 50% stenosis at a septal perforator followed by a mid 80%      stenosis with mid-moderate diffuse disease.  The circumflex had a      proximal calcified 25% stenosis.  The rest of the vessel in the AV      groove was relatively normal.  There was an obtuse marginal which was      moderate size with approximately 25% stenosis.  There was a second      obtuse marginal which was large and normal.  3.  The right coronary artery was a large dominant vessel.  It was occluded      at the ostium.  The  proximal segment filled via left-to-right      collaterals. The distal vessel was seen to be occluded before the PDA.      The PDA and posterolaterals filled extensively with left-to-right      collaterals. This was a large PDA free of high-grade disease.  The LV      was not injected.   CONCLUSION:  1.  A 2-vessel coronary artery disease.  2.  The right coronary artery is not amenable to percutaneous      revascularization.  I doubt that the LAD or left main are culprit      lesions. Given her advanced age, the nature of her disease I would      suggest medical management.  This will be discussed further with her      primary cardiologist.           ______________________________  Rollene Rotunda, M.D.  JH/MEDQ  D:  10/30/2005  T:  10/31/2005  Job:  [id number]   cc:   Olene Craven, M.D.  Fax: [phone]

## 2011-05-05 NOTE — Op Note (Signed)
[name], [name] [name]               ACCOUNT NO.:  [id number]   MEDICAL RECORD NO.:  [id number]          PATIENT TYPE:  INP   LOCATION:  6531                         FACILITY:  [facility]   PHYSICIAN:  Duke Salvia, M.D.  DATE OF BIRTH:  Aug 06, 1919   DATE OF PROCEDURE:  12/15/2005  DATE OF DISCHARGE:  12/16/2005                                 OPERATIVE REPORT   PREOPERATIVE DIAGNOSES:  1.  Polymorphic ventricular tachycardia.  2.  Ischemic heart disease, inoperable.  3.  AMIODARONE drug intolerance.   POSTOPERATIVE DIAGNOSES:  1.  Polymorphic ventricular tachycardia.  2.  Ischemic heart disease, inoperable.  3.  AMIODARONE drug intolerance.   OPERATION PERFORMED:  Single chamber defibrillator implantation with high  voltage testing, but __________  testing deferred.   CARDIOLOGIST:  Duke Salvia, M.D.   DESCRIPTION OF THE OPERATION:  After obtaining informed consent the patient  was brought to the electrophysiology laboratory and was placed on the  fluoroscopic table in the supine position.  After routine prepping and  draping of the left lateral chest the skin was infiltrated in the  prepectoral subclavicular region.  An incision was made and carried down  into the prepectoral fascia using electrocautery and sharp dissection.  The  pocket was formed similarly and hemostasis was obtained.   Our attention was turned to gain access to the extrathoracic left subclavian  vein, which was accomplished without difficulty, without aspiration of air  or puncturing the artery.  A single venipuncture was accomplished.  The  guidewire was placed and retained.  A 7 French sheath was then placed and  through this was passed a [employer]. Jude Riada 7000-60 dual coil active fixation  defibrillator, with serial number [id number] under fluoroscopic guidance with  __________  at the right ventricular apex where the left lower R wave  fixation 18.3 mV with a pacing impedance of 727 ohms and threshold at  1 volt  of 7.5 msec; anchored the threshold was 1.678.  With the these acceptable  parameters reported the lead was secured in the prepectoral fascia and then  attached to a St. Jude Atlas 2 [id number] ICD, serial number [id number].  Through the  device the bipolar R wave was 12 mV with a pacing impedance of 550 ohms, and  a threshold at 1 volt of 0.5 msec.  There is no diaphragmatic pacing at 10  volts.  High voltage impedance shock was tested and that was 29 ohms.  With  these acceptable parameters  recorded this system was implanted.   The pocket was assessed for hemostasis.  The lead in the pulse generator was  placed in the pocket, and was secured to the prepectoral fascia.   The wound was closed in three layers in the normal fashion.  The wound was  washed, dried and benzoin and Steri-strips dressing applied.   COUNTS:  Needle count, sponge count and instrument count were corrected at  the end of the procedure according to the staff.   DISPOSITION:  The patient tolerated the procedure without apparent  complication.  ______________________________  Duke Salvia, M.D.     SCK/MEDQ  D:  12/15/2005  T:  12/17/2005  Job:  [id number]   cc:   Olene Craven, M.D.  Fax: [phone]   Electrophysiology Laboratory   [facility]

## 2011-05-06 ENCOUNTER — Inpatient Hospital Stay (HOSPITAL_COMMUNITY): Payer: Medicare Other

## 2011-05-06 DIAGNOSIS — R509 Fever, unspecified: Secondary | ICD-10-CM

## 2011-05-06 DIAGNOSIS — Z9911 Dependence on respirator [ventilator] status: Secondary | ICD-10-CM

## 2011-05-06 DIAGNOSIS — G40401 Other generalized epilepsy and epileptic syndromes, not intractable, with status epilepticus: Secondary | ICD-10-CM

## 2011-05-06 DIAGNOSIS — R4182 Altered mental status, unspecified: Secondary | ICD-10-CM

## 2011-05-06 DIAGNOSIS — J9600 Acute respiratory failure, unspecified whether with hypoxia or hypercapnia: Secondary | ICD-10-CM

## 2011-05-06 DIAGNOSIS — I472 Ventricular tachycardia: Secondary | ICD-10-CM

## 2011-05-06 LAB — BLOOD GAS, ARTERIAL
Acid-Base Excess: 4.6 mmol/L — ABNORMAL HIGH (ref 0.0–2.0)
Drawn by: 252031
Drawn by: 252031
FIO2: 0.4 %
MECHVT: 400 mL
MECHVT: 400 mL
O2 Saturation: 98.9 %
PEEP: 5 cmH2O
RATE: 15 resp/min
RATE: 10 resp/min
pCO2 arterial: 34.6 mmHg — ABNORMAL LOW (ref 35.0–45.0)
pCO2 arterial: 41.4 mmHg (ref 35.0–45.0)
pH, Arterial: 7.451 — ABNORMAL HIGH (ref 7.350–7.400)
pO2, Arterial: 113 mmHg — ABNORMAL HIGH (ref 80.0–100.0)

## 2011-05-06 LAB — BASIC METABOLIC PANEL
BUN: 12 mg/dL (ref 6–23)
CO2: 29 mEq/L (ref 19–32)
Chloride: 96 mEq/L (ref 96–112)
Creatinine, Ser: 0.73 mg/dL (ref 0.4–1.2)
Potassium: 3.3 mEq/L — ABNORMAL LOW (ref 3.5–5.1)

## 2011-05-06 LAB — CBC
HCT: 37.4 % (ref 36.0–46.0)
Hemoglobin: 13.2 g/dL (ref 12.0–15.0)
MCH: 31.1 pg (ref 26.0–34.0)
MCV: 88 fL (ref 78.0–100.0)
Platelets: 189 10*3/uL (ref 150–400)
RBC: 4.25 MIL/uL (ref 3.87–5.11)
WBC: 13.5 10*3/uL — ABNORMAL HIGH (ref 4.0–10.5)

## 2011-05-06 LAB — MRSA PCR SCREENING: MRSA by PCR: NEGATIVE

## 2011-05-06 LAB — VITAMIN B12: Vitamin B-12: 474 pg/mL (ref 211–911)

## 2011-05-06 LAB — CORTISOL: Cortisol, Plasma: 20.7 ug/dL

## 2011-05-07 ENCOUNTER — Inpatient Hospital Stay (HOSPITAL_COMMUNITY): Payer: Medicare Other

## 2011-05-07 LAB — CBC
HCT: 33.2 % — ABNORMAL LOW (ref 36.0–46.0)
MCHC: 34.3 g/dL (ref 30.0–36.0)
Platelets: 170 10*3/uL (ref 150–400)
RDW: 14.6 % (ref 11.5–15.5)
WBC: 11.7 10*3/uL — ABNORMAL HIGH (ref 4.0–10.5)

## 2011-05-07 LAB — URINE CULTURE
Colony Count: NO GROWTH
Culture  Setup Time: 201205181639
Culture: NO GROWTH

## 2011-05-07 LAB — BLOOD GAS, ARTERIAL
Acid-Base Excess: 2.5 mmol/L — ABNORMAL HIGH (ref 0.0–2.0)
Bicarbonate: 26.4 mEq/L — ABNORMAL HIGH (ref 20.0–24.0)
FIO2: 0.3 %
O2 Saturation: 98.6 %
PEEP: 5 cmH2O
Patient temperature: 97.8
TCO2: 27.7 mmol/L (ref 0–100)
pO2, Arterial: 111 mmHg — ABNORMAL HIGH (ref 80.0–100.0)

## 2011-05-07 LAB — BASIC METABOLIC PANEL
BUN: 13 mg/dL (ref 6–23)
CO2: 26 mEq/L (ref 19–32)
Chloride: 100 mEq/L (ref 96–112)
GFR calc non Af Amer: >60 mL/min (ref >60)
Glucose, Bld: 91 mg/dL (ref 70–99)
Potassium: 3.9 mEq/L (ref 3.5–5.1)
Sodium: 136 mEq/L (ref 135–145)

## 2011-05-07 NOTE — Consult Note (Signed)
[name] [name], [name] [name]               ACCOUNT NO.:  [id number]  MEDICAL RECORD NO.:  [id number]           PATIENT TYPE:  I  LOCATION:  3108                         FACILITY:  [facility]  PHYSICIAN:  Levie Heritage, MD       DATE OF BIRTH:  1919/10/06  DATE OF CONSULTATION:  05/06/2011 DATE OF DISCHARGE:                                CONSULTATION   REFERRING PHYSICIAN:  Critical Care Medicine.  REASON FOR CONSULTATION:  Possible seizures.  CHIEF COMPLAINT:  Altered mental status with possible seizures.  NOTE:  The patient is unable to give the detail of her history and there is no family member available around.  Here, all the history is obtained by talking to the Critical Care Medicine team as well as by the nursing staff and reviewing the EMR on the patient.  Followings are of the pertinents of the history.  HISTORY OF PRESENT ILLNESS:  This patient is an 77 year old woman with known dementia who was brought into the hospital yesterday after medical alert device triggered the EMS call who found her in paranoid and psychotic behavior as per the EMR.  She was transported to the emergency.  En route to the hospital, she was witnessed to have possible convulsion activity with a similar episode noted in the emergency.  She was given Ativan and was intubated for the airway protection.  A CT scan of the head was performed that did not show any acute intracranial abnormality.  The patient has been intubated, currently breathing over the vent.  The propofol has been stopped since morning.  She was started on Keppra 500 mg twice a day and no further seizure-like activity has been noted.  The detail of the event witnessed by the EMS as well as ER is not available.  I have seen the patient having movement in the bed reflective of more agitation with a cycling behavior of the legs and some pelvic thrusting, but I do not see any rhythmic activities suggestive of seizure-like etiology in this  setting.  PAST MEDICAL HISTORY: 1. Myocardial infarction. 2. Hypertension. 3. Hypercholesteremia. 4. Polymorphic VT. 5. Coronary artery disease, status post ICD implantation for     polymorphic VT. 6. Hypothyroidism.  ALLERGIES: 1. FLEXERIL. 2. BENADRYL. 3. DRAMAMINE.  Current list of medications in the hospital now, she is on: 1. Amiodarone 200 mg daily. 2. Aspirin 81 mg daily. 3. Coreg 3.125 twice a day. 4. She takes Florinef 0.1 mg daily basis. 5. Keppra is given 500 mg twice a day. 6. P.r.n. basis of sedating medication since on intubation.  SOCIAL HISTORY:  Nonsmoker, nonalcoholic.  Reported to be very functional before this incident.  No history of illicit drug abuse.  FAMILY HISTORY:  Father died in his 68s of heart disease.  Mother died at age 56 of stroke.  She had siblings with the coronary artery disease as well.  REVIEW OF SYSTEMS:  Currently, review of systems is not possible.  REVIEW OF CLINICAL DATA:  I have reviewed the patient's labs and noted the nonconclusive CSF studies more suggestive of traumatic lumbar puncture.  I have also noted  mild protein elevation, but in the setting of no WBC increase and traumatic tap is likely of questionable significance.  Very mild leukocytes were noted in the urinalysis from 3- 6.  I have reviewed her CT scan of the head and noted unremarkable for any acute intracranial abnormality.  PHYSICAL EXAMINATION:  Currently intubated without any sedation and agitated and moving all 4 limbs.  This is reflective more of presentation and not a seizure-like pattern.  Vitals, 132/78 mmHg, she is breathing over the set ventilation rate.  Neurological examination is limited because of the patient's altered sensorium currently.  Reactive pupils to light with intact corneals and intact oculocephalic reflex.  She is breathing over the vent and she is moving all four extremities appropriately and also to the painful  stimuli.  The detailed neurologic examination is not possible because of the patient's current condition.  IMPRESSION and PLAN:  An 77 year old woman with altered sensorium yesterday with possible seizure-like activity noted yesterday who is currently not having any seizures and her exam has been nonfocal, but limited due to delerium.  As far as her possible seizures yesterday are concerned, no detail is available to know the semiology of the seizure, however, the patient does not have history of epilepsy and the provoked seizures do not warrant the use of antiepileptic medications.  Currently, I am leaning more towards the diagnosis of delirium than the epilepsy and would suggest treating her UTI.  I agree with critical team's idea to plan to extubate her.  The MRI is not possible given the intracardiac device, therefore please perform the EEG when available.  The possibility of a CNS ischemic event cannot be ruled out completely in this setting and the exam is limited to add to the information. Also, we cannot get the MRI of the brain, therefore I agree to continue her on aspirin and the serial exam further to shed more light in that regard.  I will start the patient on UTI antibiotics as UTI can cause similar delirium in demented patients like her and may present in similar presentation as well.          ______________________________ Levie Heritage, MD     WS/MEDQ  D:  05/06/2011  T:  05/07/2011  Job:  [id number]  Electronically Signed by Levie Heritage MD on 05/07/2011 07:53:49 AM

## 2011-05-08 ENCOUNTER — Inpatient Hospital Stay (HOSPITAL_COMMUNITY): Payer: Medicare Other

## 2011-05-08 LAB — CBC
MCH: 29.7 pg (ref 26.0–34.0)
MCHC: 33.1 g/dL (ref 30.0–36.0)
Platelets: 195 10*3/uL (ref 150–400)
RBC: 3.94 MIL/uL (ref 3.87–5.11)

## 2011-05-08 LAB — HERPES SIMPLEX VIRUS(HSV) DNA BY PCR
HSV 1 DNA: NOT DETECTED
HSV 2 DNA: NOT DETECTED

## 2011-05-08 LAB — BASIC METABOLIC PANEL
Calcium: 8.7 mg/dL (ref 8.4–10.5)
Chloride: 98 mEq/L (ref 96–112)
Creatinine, Ser: 0.55 mg/dL (ref 0.4–1.2)
GFR calc Af Amer: >60 mL/min (ref >60)
Sodium: 136 mEq/L (ref 135–145)

## 2011-05-09 LAB — CSF CULTURE W GRAM STAIN: Culture: NO GROWTH

## 2011-05-09 NOTE — Procedures (Unsigned)
REFERRING PHYSICIAN:  Dr. Michele Rockers.  HISTORY:  An 77 year old woman with dementia brought to the hospital after a medical alert device triggered patient found to be paranoid and psychotic, had abnormal movement activity in association with that which lasted for very short time.  She is now coming back around to her baseline.  MEDICATIONS:  She is on Cordarone, Florinef, Coreg, Protonix, Synthroid, fentanyl, Ativan, and Keppra.  EEG DURATION:  22 minutes of EEG recording.  His EEG description:  This is a routine 18 channel adult EEG recording with one channel devoted to limited EKG recording.  Activation procedure was performed during the photic stimulation the study is performed in awake and drowsy state.  As the EEG opens up, I noticed that the posterior dominant rhythm consist of very low amplitude of 8 Hz frequency.  There is some degree of superimposed beta activity throughout the study as well.  I do notice some poorly formed vertex waves as the patient got drowsy.  However, no deeper stages of sleep were identified during the study.  No driving was noted with photic stimulation in the posterior leads. There is no electrographic seizures or epileptiform discharges recorded during this study either.  EEG INTERPRETATION:  This is a normal awake and drowsy EEG.  There is no evidence to suggest electrographic seizures or epileptiform discharges. Clinical correlation is advised.          ______________________________ Levie Heritage, MD    ZD:GUYQ D:  05/08/2011 16:36:27  T:  05/09/2011 04:54:29  Job #:  [id number]

## 2011-05-10 ENCOUNTER — Inpatient Hospital Stay (HOSPITAL_COMMUNITY): Payer: Medicare Other

## 2011-05-11 LAB — COMPREHENSIVE METABOLIC PANEL
AST: 34 U/L (ref 0–37)
Albumin: 2.6 g/dL — ABNORMAL LOW (ref 3.5–5.2)
Calcium: 9.5 mg/dL (ref 8.4–10.5)
Creatinine, Ser: 0.72 mg/dL (ref 0.4–1.2)
GFR calc Af Amer: >60 mL/min (ref >60)
GFR calc non Af Amer: >60 mL/min (ref >60)
Total Protein: 6.7 g/dL (ref 6.0–8.3)

## 2011-05-11 LAB — CBC
MCH: 30.7 pg (ref 26.0–34.0)
MCHC: 34.6 g/dL (ref 30.0–36.0)
Platelets: 243 10*3/uL (ref 150–400)
RDW: 14.4 % (ref 11.5–15.5)

## 2011-05-12 LAB — CULTURE, BLOOD (ROUTINE X 2)
Culture  Setup Time: 201205191651
Culture: NO GROWTH

## 2011-05-30 NOTE — Discharge Summary (Signed)
[name] [name], [name] [name]               ACCOUNT NO.:  [id number]  MEDICAL RECORD NO.:  [id number]           PATIENT TYPE:  I  LOCATION:  6703                         FACILITY:  [facility]  PHYSICIAN:  Pleas Koch, MD        DATE OF BIRTH:  03/14/1919  DATE OF ADMISSION:  05/05/2011 DATE OF DISCHARGE:                              DISCHARGE SUMMARY   DISCHARGE DIAGNOSES: 1. Toxic metabolic encephalopathy, cause unclear. 2. Possibility of infection. 3. Acute hypoxic respiratory failure, intubated May 05, 2011 to May 07, 2011 and extubated. 4. Hypothyroidism. 5. Hypertension, moderately controlled. 6. History of coronary artery disease and hyperlipidemia. 7. History of seizure at home and this admission. 8. Questionable orthostatic hypotension that was on Florinef that has     been discontinued. 9. Hypokalemia. 10.History of V-tach in the past, now off of rate controlling agents. 11.Moderate-to-severe dementia. 12.Polypharmacy.  DISCHARGE MEDICATIONS:  Will be reconciled on appropriate day of discharge.  CURRENT MEDICATIONS: 1. Acetaminophen 500 1-2 tablets q.6 h. p.r.n. 2. Mirtazapine 7.5 mg at bedtime. 3. Carvedilol 6.25 mg b.i.d. 4. Aspirin enteric-coated 81 mg once daily. 5. Levothyroxine 50 mcg daily a.c.  HISTORY OF PRESENT ILLNESS:  This is a patient that I have seen today for the first time and was transferred to the [facility].  From what we can gathered from hospital course, it appears that she suffered from a sustained possible seizure on the day of discharge and had significant altered mental status requiring her to be intubated and placed in the ICU.  The patient subsequently was intubated for airway protection. Now, she had witnessed seizure x2.  CT head showed no intracranial abnormality, however, remote type II dens fracture.  CT abdomen and pelvis were chronic.  Her LP was negative.  She also was febrile after seizure and was kept on Diprivan drip.  She was  then transitioned to the floor after being extubated and being placed on numerous IV antibiotics for possible fever and leukocytosis.  HOSPITAL COURSE ACCORDING TO ISSUE: 1. Toxic metabolic encephalopathy.  There was unclear course.  I     suspect that she suffers from polypharmacy and will supply meds as     much as possible.  Her workup thus far including CTs and EEG has     been negative.  Neurology had been consulted.  She is eventually     not able to have an MRI of the brain because of her having a     pacemaker. 2. Acute hypoxic respiratory failure.  She was intubated on May 05, 2011 to May 07, 2011.  Her sats today are acceptable. 3. Infection.  Questionable infection.  Urine C and S from May 18 were     negative.  Blood and sputum from May 19 were negative.  She does     have mild leukocytosis of 11.8.  The etiology of which is     uncertain.  Her initial CBC showed a WBC of 13.0.  Also, the     patient had been on I believe stress dose steroids  in the ICU, so     we will disregard this as being a source of infection. 4. Hypothyroidism.  She has been up titrated yesterday to     levothyroxine 50 mcg.  The patient was on amiodarone until     yesterday, and her plan per critical care doctor was to discontinue     the same as she has a pacer.  We will do the same and review her     telemetry. 5. Hypotension, moderately controlled.  We will increase her Coreg  to     6.25 b.i.d. 6. History of CAD and hyperlipidemia.  Continue aspirin.  Her statin     was discontinued by PCCM appropriately as mortality benefit will be     less. 7. History of seizure at home.  She will continue on Keppra 500 q.12     h. 8. Questionable orthostatic hypotension per PCCM note.  They stated     that she was on chronic Florinef.  We will discontinue the same as     she is also hypertensive and it would not make sense due to     mutually conflicting orders. 9. Hyperkalemia.  We will replace her  potassium with 40 mEq of     potassium daily. 10.History of V-tach.  She was on amiodarone in the past.  We will not     streamline meds and discontinue the same. 11.Moderate dementia.  This will likely be worsened by recent     intubation.  She may need some low dose of Haldol on discharge. 12.Polypharmacy.  I will discontinue all nonessential meds.  PHYSICAL EXAMINATION:  GENERAL:  The patient is reviewed today, May 09, 2011, for the first time.  She thought she was at home in [address] Nurse reported that she is incontinent trying to get up. VITAL SIGNS:  Temperature is 97.6, blood pressure 123-169/78-91, respirations were 18, pulses of 82, and O2 sats of 95%.  She is pleasantly disoriented. HEENT:  EOMI, PERRLA.  She had no teeth. CHEST:  Clinically clear. HEART:  S1, S2.  No noted murmur. EXTREMITIES:  No peripheral edema. ABDOMEN:  Soft, nontender, nondistended.  LABORATORY DATA:  WBC 11.8, hemoglobin/hematocrit 11.7/35.3, potassium 3.2, BUN/creatinine 14/0.55.  Her weight was 49.9-49.  DISCHARGE CONDITION:  The patient should be stable for discharge.  I will contact social worker to help arrange the same.  Dr. Eda Paschal will describe an addendum dictation if the patient is actually stable for discharge home.          ______________________________ Pleas Koch, MD     JS/MEDQ  D:  05/09/2011  T:  05/09/2011  Job:  [id number]  Electronically Signed by Pleas Koch MD on 05/30/2011 03:40:19 PM

## 2011-06-01 ENCOUNTER — Other Ambulatory Visit: Payer: Self-pay | Admitting: Geriatric Medicine

## 2011-06-01 ENCOUNTER — Ambulatory Visit
Admission: RE | Admit: 2011-06-01 | Discharge: 2011-06-01 | Disposition: A | Discharge status: Home / Self Care | Payer: Medicare Other | Source: Ambulatory Visit | Attending: Geriatric Medicine | Admitting: Geriatric Medicine

## 2011-06-01 DIAGNOSIS — M549 Dorsalgia, unspecified: Secondary | ICD-10-CM

## 2011-07-17 NOTE — Discharge Summary (Signed)
NAME:  [name] [name], [name] [name]               ACCOUNT NO.:  [id number]  MEDICAL RECORD NO.:  [id number]           PATIENT TYPE:  I  LOCATION:  6703                         FACILITY:  [facility]  PHYSICIAN:  Carnelius Hammitt I Talbert Trembath, MD      DATE OF BIRTH:  12/04/1919  DATE OF ADMISSION:  05/05/2011 DATE OF DISCHARGE:  05/13/2011                              DISCHARGE SUMMARY   DISCHARGE DIAGNOSES: 1. Clearing delirium/dementia was possibility of toxic metabolic     encephalopathy. 2. Questionable seizure, Neurology did clear the patient as likely     cause of the patient's symptoms is delirium and dementia. 3. Hypothyroidism. 4. Hypertension. 5. History of coronary artery disease and dyslipidemia. 6. Hyperkalemia. 7. History of ventricular tachycardia. 8. Moderate to severe dementia.  DISCHARGE MEDICATIONS: 1. Coreg 6.25 mg twice daily. 2. Aspirin enteric-coated 81 mg daily. 3. Levothyroxine 50 mcg p.o. daily. 4. Aricept 10 mg p.o. daily. 5. Potassium chloride 20 mEq p.o. daily for 10 days and further     requirement per the patient's primary care. 6. Zocor 20 mg p.o. daily. 7. Tylenol 500 mg every 8 hour as needed. 8. Folic acid 1 mg p.o. daily. 9. Mirtazapine 15 mg.  The patient will take 7.5 mg at bedtime. 10.Ocuvite 1 tab daily.  HISTORY OF PRESENT ILLNESS: 1. This patient was transferred to the Hospitalist Service from the     Critical Care Service.  Apparently, the patient suffer from     sustained possible seizure and was altered mental status requiring     her to be intubated and placed in the ICU.  CT show no intracranial     abnormality other than remote type II dens fracture.  LB were     negative and Neurology were consulted.  The patient was placed on     Keppra.  EEG has been negative and Neurology recommendation to stop     Keppra and start the patient on Aricept likely the cause of the     patient's symptoms is clearing delirium precipitated by the     patient's history of  dementia and UTI. 2. The patient extubated secondary to able to protect airway and she     was easily extubated. 3. The patient treated empirically as the case of urinary tract     infection. 4. Hypothyroidism.  The patient will continue her medications. 5. History of coronary artery disease and dyslipidemia.  The patient     will continue aspirin, Zocor, and beta-blocker. 6. Hypokalemia.  Will be replaced by 20 mEq p.o. daily and we will ask     further update of her potassium supplement by her MD.  PHYSICAL EXAMINATION:  GENERAL:  On current examination, the patient is sitting on chair.  Not in respiratory distress or shortness of breath. Presently confused. VITAL SIGNS:  Temperature 97.8, blood pressure 146/68, respiratory 18, and pulse rate 69. HEART:  S1, S2.  No added sounds. LUNGS:  Normal vesicular breathing with equal air entry. ABDOMEN:  Soft, nontender.  Bowel sounds present. EXTREMITIES:  Without edema.  LABORATORY DATA:  Last blood work did show sodium  of 137, potassium 3.9, chloride 99, CO2 of 31, glucose 117, BUN 13 and creatinine 0.72.  White blood cell of 7.1, hemoglobin 12.3, hematocrit 35.6, and platelets 243.  ACTIVITY:  As tolerated by physical therapist at nursing home.  The patient at [name] [name] of fall.  Currently, we felt the patient is stable for discharge.  Case discussed with the patient's son.     Thomas Rhude Bosie Helper, MD     HIE/MEDQ  D:  05/13/2011  T:  05/13/2011  Job:  [id number]  Electronically Signed by Ebony Cargo MD on 07/17/2011 02:33:34 PM

## 2011-07-28 ENCOUNTER — Encounter: Payer: Self-pay | Admitting: *Deleted

## 2011-08-14 ENCOUNTER — Inpatient Hospital Stay (HOSPITAL_COMMUNITY)
Admission: EM | Admit: 2011-08-14 | Discharge: 2011-08-18 | DRG: 481 | Disposition: A | Discharge status: Skilled Nursing Facility | Payer: Medicare Other | Source: Other Acute Inpatient Hospital | Attending: Internal Medicine | Admitting: Internal Medicine

## 2011-08-14 ENCOUNTER — Emergency Department (HOSPITAL_COMMUNITY): Payer: Medicare Other

## 2011-08-14 ENCOUNTER — Emergency Department (HOSPITAL_COMMUNITY)
Admission: EM | Admit: 2011-08-14 | Discharge: 2011-08-14 | Disposition: A | Discharge status: Home / Self Care | Payer: Medicare Other | Source: Home / Self Care | Attending: Emergency Medicine | Admitting: Emergency Medicine

## 2011-08-14 DIAGNOSIS — I2510 Atherosclerotic heart disease of native coronary artery without angina pectoris: Secondary | ICD-10-CM | POA: Insufficient documentation

## 2011-08-14 DIAGNOSIS — E039 Hypothyroidism, unspecified: Secondary | ICD-10-CM | POA: Diagnosis present

## 2011-08-14 DIAGNOSIS — G40909 Epilepsy, unspecified, not intractable, without status epilepticus: Secondary | ICD-10-CM | POA: Diagnosis present

## 2011-08-14 DIAGNOSIS — Z9181 History of falling: Secondary | ICD-10-CM

## 2011-08-14 DIAGNOSIS — I10 Essential (primary) hypertension: Secondary | ICD-10-CM | POA: Diagnosis present

## 2011-08-14 DIAGNOSIS — W010XXA Fall on same level from slipping, tripping and stumbling without subsequent striking against object, initial encounter: Secondary | ICD-10-CM | POA: Insufficient documentation

## 2011-08-14 DIAGNOSIS — E785 Hyperlipidemia, unspecified: Secondary | ICD-10-CM | POA: Diagnosis present

## 2011-08-14 DIAGNOSIS — I252 Old myocardial infarction: Secondary | ICD-10-CM

## 2011-08-14 DIAGNOSIS — S72009A Fracture of unspecified part of neck of unspecified femur, initial encounter for closed fracture: Secondary | ICD-10-CM | POA: Insufficient documentation

## 2011-08-14 DIAGNOSIS — W19XXXA Unspecified fall, initial encounter: Secondary | ICD-10-CM | POA: Diagnosis present

## 2011-08-14 DIAGNOSIS — R5381 Other malaise: Secondary | ICD-10-CM | POA: Insufficient documentation

## 2011-08-14 DIAGNOSIS — R0602 Shortness of breath: Secondary | ICD-10-CM | POA: Insufficient documentation

## 2011-08-14 DIAGNOSIS — F0390 Unspecified dementia without behavioral disturbance: Secondary | ICD-10-CM | POA: Insufficient documentation

## 2011-08-14 DIAGNOSIS — Z7982 Long term (current) use of aspirin: Secondary | ICD-10-CM

## 2011-08-14 DIAGNOSIS — S72143A Displaced intertrochanteric fracture of unspecified femur, initial encounter for closed fracture: Principal | ICD-10-CM | POA: Diagnosis present

## 2011-08-14 DIAGNOSIS — Z95810 Presence of automatic (implantable) cardiac defibrillator: Secondary | ICD-10-CM

## 2011-08-14 DIAGNOSIS — F068 Other specified mental disorders due to known physiological condition: Secondary | ICD-10-CM | POA: Diagnosis present

## 2011-08-14 DIAGNOSIS — Y92009 Unspecified place in unspecified non-institutional (private) residence as the place of occurrence of the external cause: Secondary | ICD-10-CM | POA: Insufficient documentation

## 2011-08-14 DIAGNOSIS — D62 Acute posthemorrhagic anemia: Secondary | ICD-10-CM | POA: Diagnosis not present

## 2011-08-14 DIAGNOSIS — Z7901 Long term (current) use of anticoagulants: Secondary | ICD-10-CM

## 2011-08-14 LAB — URINALYSIS, ROUTINE W REFLEX MICROSCOPIC
Glucose, UA: NEGATIVE mg/dL
Ketones, ur: NEGATIVE mg/dL
Leukocytes, UA: NEGATIVE
Nitrite: NEGATIVE
Protein, ur: NEGATIVE mg/dL
pH: 7.5 (ref 5.0–8.0)

## 2011-08-14 LAB — CBC
HCT: 37.3 % (ref 36.0–46.0)
Hemoglobin: 12.3 g/dL (ref 12.0–15.0)
MCH: 29.9 pg (ref 26.0–34.0)
MCHC: 33 g/dL (ref 30.0–36.0)
MCV: 90.8 fL (ref 78.0–100.0)

## 2011-08-14 LAB — BASIC METABOLIC PANEL
CO2: 33 mEq/L — ABNORMAL HIGH (ref 19–32)
Chloride: 97 mEq/L (ref 96–112)
Creatinine, Ser: 0.72 mg/dL (ref 0.50–1.10)
GFR calc Af Amer: >60 mL/min (ref >60)
Potassium: 4.3 mEq/L (ref 3.5–5.1)
Sodium: 135 mEq/L (ref 135–145)

## 2011-08-14 LAB — TYPE AND SCREEN: Antibody Screen: NEGATIVE

## 2011-08-14 LAB — PROTIME-INR
INR: 0.96 (ref 0.00–1.49)
Prothrombin Time: 13 seconds (ref 11.6–15.2)

## 2011-08-14 LAB — CK TOTAL AND CKMB (NOT AT ARMC)
Relative Index: INVALID (ref 0.0–2.5)
Total CK: 92 U/L (ref 7–177)

## 2011-08-15 ENCOUNTER — Inpatient Hospital Stay (HOSPITAL_COMMUNITY): Payer: Medicare Other

## 2011-08-15 LAB — ABO/RH: ABO/RH(D): A POS

## 2011-08-15 LAB — CBC
MCH: 30.4 pg (ref 26.0–34.0)
MCV: 89.3 fL (ref 78.0–100.0)
Platelets: 223 10*3/uL (ref 150–400)
RDW: 14.7 % (ref 11.5–15.5)

## 2011-08-15 LAB — COMPREHENSIVE METABOLIC PANEL
BUN: 10 mg/dL (ref 6–23)
CO2: 29 mEq/L (ref 19–32)
Calcium: 8.5 mg/dL (ref 8.4–10.5)
Creatinine, Ser: 0.64 mg/dL (ref 0.50–1.10)
GFR calc Af Amer: >60 mL/min (ref >60)
GFR calc non Af Amer: >60 mL/min (ref >60)
Glucose, Bld: 120 mg/dL — ABNORMAL HIGH (ref 70–99)
Total Protein: 6.5 g/dL (ref 6.0–8.3)

## 2011-08-15 LAB — DIFFERENTIAL
Basophils Relative: 0 % (ref 0–1)
Eosinophils Absolute: 0.1 10*3/uL (ref 0.0–0.7)
Eosinophils Relative: 1 % (ref 0–5)
Lymphs Abs: 1.8 10*3/uL (ref 0.7–4.0)
Monocytes Relative: 13 % — ABNORMAL HIGH (ref 3–12)
Neutrophils Relative %: 70 % (ref 43–77)

## 2011-08-15 LAB — SURGICAL PCR SCREEN: Staphylococcus aureus: POSITIVE — AB

## 2011-08-15 LAB — TROPONIN I: Troponin I: <0.3 ng/mL (ref <0.30)

## 2011-08-15 LAB — CK TOTAL AND CKMB (NOT AT ARMC)
Relative Index: 3.8 — ABNORMAL HIGH (ref 0.0–2.5)
Total CK: 92 U/L (ref 7–177)

## 2011-08-15 LAB — MAGNESIUM: Magnesium: 1.8 mg/dL (ref 1.5–2.5)

## 2011-08-15 NOTE — Consult Note (Signed)
[name], [name]               ACCOUNT NO.:  [id number]  MEDICAL RECORD NO.:  [id number]  LOCATION:  WLED                         FACILITY:  [facility]  PHYSICIAN:  Eulas Post, MD    DATE OF BIRTH:  [birth year]  DATE OF CONSULTATION:  08/14/2011 DATE OF DISCHARGE:                                CONSULTATION   CHIEF COMPLAINT:  Right hip pain.  HISTORY:  [name] is an 77 year old woman who fell apparently today and injured her right hip.  She was unable to walk.  She had acute onset severe pain.  She was brought in by EMS.  She had no other associated injuries.  She normally walks with a walker which she calls "her boyfriend."  She has had worsening balance with increasing falls recently, with dizziness.  She was recently discharged in May 2012, for possible seizure with altered mental status, and also had developed acute hypoxic respiratory failure.  She has known dementia and delirium.  PAST MEDICAL HISTORY:  Significant for; 1. Cardiac disease. 2. Delirium and dementia. 3. A seizure disorder. 4. History of a pacemaker. 5. History of hypoxic respiratory failure. 6. Dizziness, somewhat chronic. 7. History of ventricular tachycardia status post pacemaker.  FAMILY HISTORY:  She says her parents died a long time ago and she does not know what their medical problems were.  SOCIAL HISTORY:  She is a nonsmoker, and has recently been living in [place], but apparently her family has been looking towards placement in a long-term care facility for her, although this has not yet happened.  REVIEW OF SYSTEMS:  Review of systems was positive for the musculoskeletal complaints as above, as well as for dizziness.  She denies any other changes in her health in the recent past, but she herself is not a very good historian.  I get much of her history from her son, who was at the bedside.  He is from out of town.  All other systems were negative.  PHYSICAL EXAM:   CONSTITUTIONAL:  She was appropriate with me throughout the interaction, but is not oriented to date, and minimally to place. EYE EXAM:  Her extraocular movements are grossly intact. LYMPHATIC EXAM:  I do not appreciate any axillary or cervical lymphadenopathy. CARDIOVASCULAR EXAM:  She has no significant pedal edema, and EHL and FHL are firing. RESPIRATORY EXAM:  She has no gross cyanosis; however, when she dozes off, it appears that she gets a little bit hypoxic and has a limited drive to breathe. GI EXAM:  Her abdomen is soft, nontender with no rebound or guarding. PSYCHIATRIC EXAM:  She is not competent for consent, but she does understand her situation and can communicate with me. SKIN EXAM:  She has no skin breaks on her right hip. NEUROLOGIC EXAM:  EHL and FHL are firing bilaterally.  Her sensation seems to be grossly intact. MUSCULOSKELETAL EXAM:  Her left hip is traumatic and has intact straight leg raise.  Her right hip has positive log roll and pain to palpation.  IMAGING:  X-rays demonstrate evidence for a displaced right intertrochanteric hip fracture.  IMPRESSION:  Right hip fracture with multiple coexisting comorbidities including cardiac disease, recent  seizures as well as delirium, recent hospitalization with dementia, history of ventricular tachycardia, as well as a history of hypoxic respiratory failure.  PLAN:  This is an acute severe injury which is going to threaten her long-term ambulatory function.  She has high risk of both morbidity and mortality, which I have discussed with the patient and the family. Nonetheless, I would recommend surgical intervention both as a palliative as well as hopefully functional intervention in order to allow her to regain ambulatory function.  She does have history of respiratory failure, and I have recommended minimizing narcotics, using Tylenol as the primary source of pain control, and possibly hydrocodone with only a very  little bit of morphine if needed for breakthrough. Morphine and narcotics, however, are certainly going to exacerbate her respiratory inhibition, as well as potentially cause a deterioration in her cognitive function.  I would rather her have some degree of pain rather than those other complications.  The risks, benefits, and alternatives were discussed with family as far as surgical versus nonsurgical intervention, and they would like for her to proceed.  She is going to be optimized by the Medical Service and we will plan for surgery tomorrow at [facility], where they have daytime surgical time available.     Eulas Post, MD     JPL/MEDQ  D:  08/14/2011  T:  08/14/2011  Job:  [id number]  Electronically Signed by Teryl Lucy MD on 08/15/2011 12:43:26 PM

## 2011-08-16 DIAGNOSIS — I2510 Atherosclerotic heart disease of native coronary artery without angina pectoris: Secondary | ICD-10-CM

## 2011-08-16 LAB — COMPREHENSIVE METABOLIC PANEL
Albumin: 2.3 g/dL — ABNORMAL LOW (ref 3.5–5.2)
BUN: 12 mg/dL (ref 6–23)
Creatinine, Ser: 0.64 mg/dL (ref 0.50–1.10)
Potassium: 4.3 mEq/L (ref 3.5–5.1)
Total Protein: 5.8 g/dL — ABNORMAL LOW (ref 6.0–8.3)

## 2011-08-16 LAB — PROTIME-INR
INR: 1.04 (ref 0.00–1.49)
Prothrombin Time: 13.8 seconds (ref 11.6–15.2)

## 2011-08-16 LAB — CBC
MCH: 30.7 pg (ref 26.0–34.0)
Platelets: 175 10*3/uL (ref 150–400)
RBC: 2.7 MIL/uL — ABNORMAL LOW (ref 3.87–5.11)

## 2011-08-17 LAB — CROSSMATCH
ABO/RH(D): A POS
Antibody Screen: NEGATIVE
Unit division: 0
Unit division: 0

## 2011-08-17 LAB — CBC
HCT: 30.6 % — ABNORMAL LOW (ref 36.0–46.0)
MCV: 88.4 fL (ref 78.0–100.0)
RBC: 3.46 MIL/uL — ABNORMAL LOW (ref 3.87–5.11)
WBC: 10.6 10*3/uL — ABNORMAL HIGH (ref 4.0–10.5)

## 2011-08-17 NOTE — Op Note (Signed)
[name], [name]               ACCOUNT NO.:  [id number]  MEDICAL RECORD NO.:  [id number]  LOCATION:  3733                         FACILITY:  [facility]  PHYSICIAN:  Eulas Post, MD    DATE OF BIRTH:  10-05-1919  DATE OF PROCEDURE:  08/15/2011 DATE OF DISCHARGE:                              OPERATIVE REPORT   ATTENDING SURGEON:  Eulas Post, MD  FIRST ASSISTANT:  Janace Litten, OPA, present and scrubbed throughout the case with assistance with instrumentation as well as reduction and closure.  PREOPERATIVE DIAGNOSIS:  Right intertrochanteric hip fracture.  POSTOPERATIVE DIAGNOSIS:  Right intertrochanteric hip fracture.  OPERATIVE PROCEDURE:  Right hip trochanteric femoral nailing.  ANESTHESIA:  General.  ESTIMATED BLOOD LOSS:  75 mL.  OPERATIVE IMPLANT:  Synthes 11 x 360-mm trochanteric femoral nail with a size 100-mm helical blade.  PREOPERATIVE INDICATIONS:  [name] is an 77 year old woman who fell and broke her right hip.  She had severe pain.  She also has dementia as well as cardiac disease and has a pacemaker for a history of ventricular tachycardia.  She has also had a history of a hypoxic respiratory failure as well as seizure disorder.  Despite all of these risks, surgical management was elected.  The risks, benefits and alternatives were discussed with the patient as well as the family including, but not limited to risks of infection, bleeding, nerve injury, malunion, nonunion, hardware prominence, hardware failure, need for further surgical intervention, cardiopulmonary complications, blood clots, blood transfusion, among others and they are willing to proceed.  OPERATIVE PROCEDURE:  The patient was brought to the operating room and placed in the supine position.  IV antibiotics were given.  General anesthesia was administered.  She was placed on the fracture table.  The right lower extremity was prepped and draped in the usual  sterile fashion.  Closed reduction was applied using traction table.  Care was taken to pad all the bony prominences and protect her skin.  Incision was made proximal to the greater trochanter after time-out was performed and sterile prep and drape, and I introduced a guidewire into the appropriate position.  The reamer was used to open the proximal femur. I had artery measured the length of the nail and I inserted the nail by hand.  This went down smoothly.  I did have a slight bit of interference fit, but not enough to require reaming, and I put the nail down by hand and I did not need a mallet until the last couple of millimeters in order to fully seat the nail.  Once the nail was in place, I confirmed reduction and I had slight valgus alignment and inserted a guidepin into the center-center position of the femoral head.  The length was measured and then I reamed over the guidepin using the step reamer and then secured the head segment with a helical blade.  Excellent fixation was achieved.  The bone quality was mediocre to poor.  The C-arm was used to confirm final placement and take final pictures and then the wounds were irrigated copiously and I did secure the proximal portion of the nail with the screwdriver and took it off  a half turn in order to allow sliding.  I irrigated the wounds copiously and closed the deep tissue with Vicryl followed by Vicryl for the subcutaneous tissue and Monocryl with Steri-Strips and sterile gauze for the skin.  The patient was awakened and returned to the PACU in stable and satisfactory condition. There were no complications.  She tolerated the procedure well.  She will be weightbearing as tolerated and will be placed on Lovenox bridging the Coumadin for a period of 1 month with a goal INR of 2-3. There were no complications.  She tolerated the procedure well.     Eulas Post, MD     JPL/MEDQ  D:  08/15/2011  T:  08/15/2011  Job:   [id number]  Electronically Signed by Teryl Lucy MD on 08/17/2011 10:23:47 AM

## 2011-08-18 LAB — PROTIME-INR: Prothrombin Time: 44.1 seconds — ABNORMAL HIGH (ref 11.6–15.2)

## 2011-09-01 ENCOUNTER — Telehealth: Payer: Self-pay | Admitting: *Deleted

## 2011-09-01 DIAGNOSIS — T82198A Other mechanical complication of other cardiac electronic device, initial encounter: Secondary | ICD-10-CM

## 2011-09-01 NOTE — Telephone Encounter (Signed)
Checking lead 

## 2011-09-05 NOTE — H&P (Signed)
NAME:  [name] [name], [name] [name]               ACCOUNT NO.:  [id number]  MEDICAL RECORD NO.:  [id number]  LOCATION:  WLED                         FACILITY:  [facility]  PHYSICIAN:  Aeisha Minarik, DO         DATE OF BIRTH:  15-May-1919  DATE OF ADMISSION:  08/14/2011 DATE OF DISCHARGE:                             HISTORY & PHYSICAL   CHIEF COMPLAINT:  Hip fracture.  HISTORY OF PRESENT ILLNESS:  The patient is an 77 year old female who suffered a fall earlier today at home.  The fall itself was unwitnessed, but given the circumstances, it appears that the patient was trying to get her walker out of the way to clear path in the hallway when she fell.  According to the son, the patient has been doing well lately, eating well.  No problems at all.  PAST MEDICAL HISTORY:  Significant for dementia and delirium; hypothyroidism; hypertension; coronary artery disease, status post MI, which was treated with medical therapy only; dyslipidemia; history of arrhythmia for which the patient has an AICD; failure to thrive, which has resolved; and recurrent falls and seizure disorder, the patient's most recent seizure was 6 weeks ago while she was an inpatient at [facility].  She has both what appears to be absence seizures as well as general tonic clonic.  PAST SURGICAL HISTORY:  Includes an AICD placement.  MEDICATIONS AT HOME:  Include: 1. Thyroid 3.125 one p.o. b.i.d. 2. Keppra 500 mg one p.o. b.i.d. 3. Synthroid 50 mcg one p.o. daily. 4. IVIG one p.o. every morning. 5. Lyrica 25 mg one p.o. b.i.d. 6. Aspirin enteric-coated 81 mg one p.o. every morning. 7. Cranberry 1 tablet p.o. daily. 8. Multivitamin one p.o. every morning and lisinopril 10 mg one p.o.     at bedtime. 9. Mirtazapine 15 mg half a tablet daily at bedtime.  ALLERGIES:  To FLEXERIL, BENADRYL, and DRAMAMINE.  SOCIAL HISTORY:  No tobacco.  No alcohol.  No recreational drug use.  REVIEW OF SYSTEMS:  CONSTITUTIONAL:  Negative for fever.   Negative for chills.  Negative for weakness.  Negative for fatigue.  CNS:  No headaches.  No seizures.  No limb weakness.  ENT:  No throat pain.  No coryza.  No nasal congestion.  HEART:  No chest pain or palpitations. No orthopnea.  RESPIRATORY:  No cough.  No shortness breath.  No wheezing.  GASTROINTESTINAL:  No nausea.  No vomiting.  No constipation. No diarrhea.  GENITOURINARY:  No dysuria.  No hematuria.  No urinary frequency.  RENAL:  No flank pain.  No swelling.  No pruritus.  SKIN: No rashes.  No sores or lesions.  HEMATOLOGIC:  No easy bruising.  No purpura or clots.  LYMPHS:  No lymphadenopathy.  No painful nodes or specific lymph swelling.  PSYCHIATRIC:  No anxiety.  No depression.  No insomnia.  FAMILY HISTORY:  Father died in his 16s of heart attack.  Mother died in her 94s of a stroke.  She has siblings with coronary artery disease.  PHYSICAL EXAMINATION:  VITAL SIGNS:  Temperature 98.3, pulse 63, respiratory rate 12, blood pressure 160/70, and O2 sat 100% on 2 L. GENERAL:  The patient  is in great distress from pain.  We have asked for more pain medicine and are waiting for it to arrive.  She is unable to help with history and/or physical, both due to her dementia and her severe distress over pain. HEENT:  Eyes; pupils equal, round, and reactive to light and accommodation.  Unable to corporate with external ocular movement exam for the above-stated reasons.  Sclerae nonicteric, noninjected.  Mouth, mucosa dry.  No lesions.  No sores.  Pharynx, clean.  No erythema.  No exudate. NECK:  Negative for JVD.  Negative for thyromegaly.  Negative for lymphadenopathy. HEART:  Regular rate and rhythm at 80 beats per minute without murmur, ectopy, or gallops.  No lateral PMI.  No thrills. LUNGS:  Clear to auscultation bilaterally without wheezes, rales, or rhonchi.  No increased work of breathing.  No tactile fremitus. ABDOMEN:  Soft, nontender, and nondistended.  Positive bowel  sounds.  No hepatosplenomegaly.  No hernias palpated. EXTREMITIES:  Negative for cyanosis, clubbing, or edema.  She has diminished dorsalis pedis and popliteal pulses bilaterally.  No carotid bruits bilaterally. NEUROLOGICAL:  The patient is moving all extremities.  LABORATORY DATA:  EKG demonstrates normal sinus rhythm with one PAC. WBC is 10, hemoglobin 12.3, hematocrit 37.3, and platelets 243.  PT is 13, INR 0.96, sodium 135, potassium 4.3, chloride 97, CO2 of 33, BUN 8, and creatinine 0.72.  Chest x-ray shows low lung volumes, vascular crowding, and mild left basilar air space disease versus atelectasis. Hip, right intertrochanteric femur fracture.  Calcium is 9.3.  ASSESSMENT: 1. Hip fracture.  The patient clearly has very serious medical     problems; however, given the very high morbidity and mortality     associated with not proceeding to surgery with this patient, I     believe it is appropriate for this patient to have this fracture     operatively corrected so long as she receives perioperative beta     blockers and postoperative CK and troponins.  We will also check an     echocardiogram for the ejection fraction to guide fluid management     in the perioperative period. 2. Coronary artery disease and history of an myocardial infarction.     We will continue her medical therapy. 3. Seizure disorder, last seizure was 6 weeks ago.  We intend to keep     the patient on her current medications. 4. History of arrhythmia.  The patient has an automatic implantable     cardioverter-defibrillator.  Again, her electrolytes will be     carefully monitored and we will continue her on her beta-blocker. 5. Dementia and a history of delirium, early mobilization will be     important in preventing delirium in this patient, I will try to     avoid any sedatives that the patient is not already on to further     reduce her risk of delirium. 6. Hypothyroidism.  Continue her Synthroid. 7.  Dyslipidemia. 8. Recurrent falls.  The patient clearly will need SNF following her     inpatient stay.  PLAN: 1. The patient will be admitted.  Orthopedic Surgery obviously     consulted to treat her fracture. 2. The patient will receive pain control and DVT prophylaxis. 3. She will be continued on her medications including her beta-     blocker. 4. We will check CKs and troponins both preoperatively and     postoperatively. 5. We will check an echocardiogram for ejection fraction to  guide     fluid management in the perioperative. 6. The patient will need a SNF placement following her procedure.          ______________________________ Fran Lowes, DO     AS/MEDQ  D:  08/14/2011  T:  08/14/2011  Job:  [id number]  Electronically Signed by Fran Lowes DO on 09/05/2011 10:40:57 PM

## 2011-09-07 ENCOUNTER — Emergency Department (HOSPITAL_COMMUNITY)
Admission: EM | Admit: 2011-09-07 | Discharge: 2011-09-07 | Disposition: A | Discharge status: Home / Self Care | Payer: Medicare Other | Attending: Emergency Medicine | Admitting: Emergency Medicine

## 2011-09-07 DIAGNOSIS — Z79899 Other long term (current) drug therapy: Secondary | ICD-10-CM | POA: Insufficient documentation

## 2011-09-07 DIAGNOSIS — T82897A Other specified complication of cardiac prosthetic devices, implants and grafts, initial encounter: Secondary | ICD-10-CM | POA: Insufficient documentation

## 2011-09-07 DIAGNOSIS — M79609 Pain in unspecified limb: Secondary | ICD-10-CM | POA: Insufficient documentation

## 2011-09-07 DIAGNOSIS — Y849 Medical procedure, unspecified as the cause of abnormal reaction of the patient, or of later complication, without mention of misadventure at the time of the procedure: Secondary | ICD-10-CM | POA: Insufficient documentation

## 2011-09-07 DIAGNOSIS — F068 Other specified mental disorders due to known physiological condition: Secondary | ICD-10-CM | POA: Insufficient documentation

## 2011-09-07 DIAGNOSIS — I10 Essential (primary) hypertension: Secondary | ICD-10-CM | POA: Insufficient documentation

## 2011-09-07 DIAGNOSIS — E7800 Pure hypercholesterolemia, unspecified: Secondary | ICD-10-CM | POA: Insufficient documentation

## 2011-09-07 DIAGNOSIS — Z95810 Presence of automatic (implantable) cardiac defibrillator: Secondary | ICD-10-CM | POA: Insufficient documentation

## 2011-09-07 DIAGNOSIS — I252 Old myocardial infarction: Secondary | ICD-10-CM | POA: Insufficient documentation

## 2011-09-07 LAB — BASIC METABOLIC PANEL
BUN: 16 mg/dL (ref 6–23)
Calcium: 10 mg/dL (ref 8.4–10.5)
Creatinine, Ser: 0.69 mg/dL (ref 0.50–1.10)
GFR calc non Af Amer: >60 mL/min (ref >60)
Glucose, Bld: 106 mg/dL — ABNORMAL HIGH (ref 70–99)

## 2011-09-07 LAB — CBC
HCT: 41.2 % (ref 36.0–46.0)
Hemoglobin: 13.7 g/dL (ref 12.0–15.0)
MCH: 31.2 pg (ref 26.0–34.0)
MCHC: 33.3 g/dL (ref 30.0–36.0)
MCV: 93.8 fL (ref 78.0–100.0)
Platelets: 370 K/uL (ref 150–400)
RBC: 4.39 MIL/uL (ref 3.87–5.11)
RDW: 15.6 % — ABNORMAL HIGH (ref 11.5–15.5)
WBC: 8.6 K/uL (ref 4.0–10.5)

## 2011-09-07 LAB — URINALYSIS, ROUTINE W REFLEX MICROSCOPIC
Hgb urine dipstick: NEGATIVE
Nitrite: NEGATIVE
Protein, ur: NEGATIVE mg/dL
Urobilinogen, UA: 1 mg/dL (ref 0.0–1.0)

## 2011-09-11 NOTE — Discharge Summary (Signed)
[name] [name], STELLY               ACCOUNT NO.:  [id number]  MEDICAL RECORD NO.:  [id number]  LOCATION:  3733                         FACILITY:  [facility]  PHYSICIAN:  Jonny Ruiz, MD    DATE OF BIRTH:  [birth year]  DATE OF ADMISSION:  08/14/2011 DATE OF DISCHARGE:  08/18/2011                        DISCHARGE SUMMARY - REFERRING   ADMITTING DIAGNOSIS:  Right intertrochanteric hip fracture.  DISCHARGE DIAGNOSES: 1. Right intertrochanteric hip fracture. 2. Postoperative anemia.  SECONDARY DIAGNOSES: 1. Dementia. 2. Hypothyroidism. 3. Hypertension. 4. Coronary artery disease, status post myocardial infarction, status     post automatic implantable cardioverter defibrillator. 5. Dyslipidemia. 6. Seizure disorder. 7. Recurrent falls.  DISCHARGE MEDICATIONS: 1. Aspirin 81 mg a day. 2. Coreg 3.125 mg p.o. b.i.d. 3. Aricept 10 mg p.o. at bedtime. 4. Lovenox 40 mg subcu daily. 5. Keppra 500 mg p.o. b.i.d. 6. Synthroid 50 mcg p.o. daily. 7. Remeron 7.5 mg p.o. at bedtime. 8. Multivitamin 1 tablet p.o. daily. 9. MiraLax 17 g p.o. daily. 10.Lyrica 25 mg p.o. b.i.d. 11.Warfarin 1 mg p.o. daily (on hold due to PT/INR of 4.5). 12.Tylenol 325 mg 2 tablets p.o. q.6 h. p.r.n. for pain. 13.Norco 1-2 tablets p.o. q.4 h. p.r.n. for moderate-to-severe pain.  PROCEDURES: 1. Right hip trochanteric femoral nailing, date August 15, 2011. 2. 2-D echocardiogram.  REASON FOR HOSPITALIZATION:  Ms. Pesnell is an 77 year old frail elderly woman who fell and broke her right hip and was admitted to the hospital for hip repair.  For details of her history, please refer to dictated H and P.  HOSPITAL COURSE:  Ms. Hayden was admitted to the hospital and underwent preoperative medical clearance with cardiac enzymes which were negative and 2-D echocardiogram.  She went to the OR on August 15, 2011 and underwent right hip trochanteric femoral nailing successfully.  She had an estimated blood loss of 75  mL.  Postoperatively, her hemoglobin dropped to 8.3 and she received 2 units of packed RBCs.  The patient had no complications postoperatively.  Her echo showed mild aortic stenosis with an ejection fraction 55% to 60%.  She had no electrolyte disturbances and her BUN and creatinine remained stable.  Her pain was managed appropriately with combination of Tylenol and Norco.  She received preoperative DVT prophylaxis and currently is on Coumadin 1 mg a day overlapping with Lovenox per DVT prophylaxis protocol.  Today, the patient's PT/INR jumped to 4.56 and her Coumadin will be put on hold until her INR reaches therapeutic level between 2 and 3.  Once her PT/INR is therapeutic for 2 days, it is okay to discontinue Lovenox. The patient is comfortable and in stable condition to be discharged.  CONDITION UPON DISCHARGE:  Stable.  SPECIAL INSTRUCTIONS:  Hold Coumadin and repeat PT INR daily until reaches goal of INR 2-3.  Maintain Lovenox on board and after 2 days of therapeutic INR, discontinue it.  Continue on Coumadin for 3-6 months.CONSULTANTS:  Dr. Teryl Lucy from Orthopedic Surgery Service.          ______________________________ Jonny Ruiz, MD     GL/MEDQ  D:  08/18/2011  T:  08/18/2011  Job:  [id number]  Electronically Signed by Jonny Ruiz MD on 09/11/2011  04:05:57 PM

## 2011-09-28 LAB — CBC
HCT: 36.7
Platelets: 314
RDW: 16.3 — ABNORMAL HIGH

## 2011-09-28 LAB — APTT: aPTT: 25

## 2011-09-28 LAB — BASIC METABOLIC PANEL
BUN: 10
Calcium: 9.2
Creatinine, Ser: 1.02
GFR calc non Af Amer: 51 — ABNORMAL LOW
Glucose, Bld: 90

## 2011-09-28 LAB — PROTIME-INR
INR: 0.9
Prothrombin Time: 12.8

## 2011-11-10 ENCOUNTER — Encounter: Payer: Self-pay | Admitting: Internal Medicine

## 2011-11-15 ENCOUNTER — Encounter: Payer: Medicare Other | Admitting: Internal Medicine

## 2011-12-06 ENCOUNTER — Encounter: Payer: Self-pay | Admitting: Internal Medicine

## 2011-12-08 ENCOUNTER — Encounter: Payer: Self-pay | Admitting: Internal Medicine

## 2011-12-08 ENCOUNTER — Ambulatory Visit (INDEPENDENT_AMBULATORY_CARE_PROVIDER_SITE_OTHER): Payer: Medicare Other | Admitting: Internal Medicine

## 2011-12-08 VITALS — BP 110/58 | HR 135

## 2011-12-08 DIAGNOSIS — Z95810 Presence of automatic (implantable) cardiac defibrillator: Secondary | ICD-10-CM | POA: Insufficient documentation

## 2011-12-08 DIAGNOSIS — I499 Cardiac arrhythmia, unspecified: Secondary | ICD-10-CM

## 2011-12-08 DIAGNOSIS — I4891 Unspecified atrial fibrillation: Secondary | ICD-10-CM | POA: Insufficient documentation

## 2011-12-08 DIAGNOSIS — I472 Ventricular tachycardia, unspecified: Secondary | ICD-10-CM | POA: Insufficient documentation

## 2011-12-08 DIAGNOSIS — I428 Other cardiomyopathies: Secondary | ICD-10-CM

## 2011-12-08 LAB — ICD DEVICE OBSERVATION
BRDY-0002RV: 40 {beats}/min
CHARGE TIME: 0 s
TOT-0006: 20121221000000
TOT-0007: 0
TOT-0010: 69
TZAT-0012SLOWVT: 200 ms
TZAT-0013SLOWVT: 2
TZON-0005SLOWVT: 6
TZST-0001SLOWVT: 3
TZST-0001SLOWVT: 5
TZST-0003SLOWVT: 36 J
TZST-0003SLOWVT: 36 J

## 2011-12-08 LAB — BASIC METABOLIC PANEL
BUN: 10 mg/dL (ref 6–23)
Creatinine, Ser: 0.8 mg/dL (ref 0.4–1.2)
GFR: 71.57 mL/min (ref >60.00)

## 2011-12-08 LAB — MAGNESIUM: Magnesium: 1.5 mg/dL (ref 1.5–2.5)

## 2011-12-08 MED ORDER — METOPROLOL SUCCINATE ER 50 MG PO TB24: 50.0000 mg | ORAL_TABLET | Freq: Every day | ORAL | Status: DC

## 2011-12-08 NOTE — Assessment & Plan Note (Signed)
The patient's device was interrogated.  The information was reviewed. No changes were made in the programming.    

## 2011-12-08 NOTE — Progress Notes (Signed)
  HPI  [name] [name] is a 77 y.o. female  is seen in followup for polymorphic ventricular tachycardia and syncope for which she takes amiodarone. She has a nonischemic cardiomyopathy.  She continues to complain of dizziness. According to her she has not been referred to neurology. I think we have to make a decision as related to the amiodarone at least until we can exclude that. The amiodarone was discontinued; the dizziness has persisted  The patient denies SOB, chest pain, edema or palpitations  Her major problem is leg cramps. She is still staying at home.  Past Medical History  Diagnosis Date  . Hypertension   . MI (myocardial infarction)   . Hyperlipidemia, mixed   . Heart murmur   . Arthritis   . Osteoporosis   . Hypothyroidism   . AICD (automatic cardioverter/defibrillator) present     St. Jude    Past Surgical History  Procedure Date  . Insert / replace / remove pacemaker     ST. JUDE ICD    Current Outpatient Prescriptions  Medication Sig Dispense Refill  . carvedilol (COREG) 3.125 MG tablet Take 3.125 mg by mouth 2 (two) times daily with a meal.        . Cholecalciferol (VITAMIN D3) 2000 UNITS TABS Take by mouth.        . donepezil (ARICEPT) 10 MG tablet Take 10 mg by mouth at bedtime.        . levETIRAcetam (KEPPRA) 500 MG tablet Take 500 mg by mouth daily.        Marland Kitchen levothyroxine (SYNTHROID, LEVOTHROID) 50 MCG tablet Take 50 mcg by mouth daily.        . multivitamin (THERAGRAN) per tablet Take 1 tablet by mouth daily.        . pregabalin (LYRICA) 25 MG capsule Take 25 mg by mouth daily.        Marland Kitchen rOPINIRole (REQUIP) 0.25 MG tablet Take 0.25 mg by mouth daily.         BP 110/58  Pulse 135  No Known Allergies  Review of Systems negative except from HPI and PMH  Physical Exam Well developed and Somewhat cachectic-appearing elderly woman who Appears disheveled HENT normal E scleral and icterus clear Neck Supple Clear to ausculation irregularly irregular  rapid , no murmurs gallops or rub Soft with active bowel sounds No clubbing cyanosis none Edema Alert; she is aware that she is in [place] And aware of her name and thinks it is in the 1980s and a holiday coming is July 4 grossly normal motor and sensory function although she is in a wheel chair Skin Warm and Dry   Assessment and  Plan

## 2011-12-08 NOTE — Patient Instructions (Signed)
Your physician recommends that you schedule a follow-up appointment in: 3 months in the device clinic   Your physician has recommended you make the following change in your medication:  1) Stop Carvedilol 2) Start Toprol 50mg  daily  Your physician recommends that you return for lab work today  BMP/Mag/TSH

## 2011-12-08 NOTE — Assessment & Plan Note (Signed)
No intercurrent ventricular tachycardia 

## 2011-12-08 NOTE — Assessment & Plan Note (Signed)
EF was 55% in 2005; I don't see an intercurrent echo. I suspect is not as good.

## 2011-12-08 NOTE — Assessment & Plan Note (Signed)
Atrial fibrillation at a rate uncontrolled ventricular response. This is likely a consequence of the withdrawing of the amiodarone. We'll discontinue her carvedilol and put her on metoprolol succinate which has more rate control.

## 2011-12-09 MED ORDER — METOPROLOL SUCCINATE ER 50 MG PO TB24: 50.0000 mg | ORAL_TABLET | Freq: Every day | ORAL | Status: DC

## 2011-12-13 ENCOUNTER — Telehealth: Payer: Self-pay | Admitting: Internal Medicine

## 2011-12-13 DIAGNOSIS — I472 Ventricular tachycardia: Secondary | ICD-10-CM

## 2011-12-13 DIAGNOSIS — I428 Other cardiomyopathies: Secondary | ICD-10-CM

## 2011-12-13 DIAGNOSIS — Z95810 Presence of automatic (implantable) cardiac defibrillator: Secondary | ICD-10-CM

## 2011-12-13 NOTE — Telephone Encounter (Signed)
Pt was notified that we do not have samples of Toprol XL.

## 2011-12-13 NOTE — Telephone Encounter (Signed)
New Problem:    Patient's caregiver called because the patients son asked her to call and ask for some free samples of all of the medication that Dr. Graciela Husbands prescribed to her on her last visit.  Says that his mother has gotten a new insurance plan that does not begin until 2013, and right now she has to pay out of pocket and cannot afford the medication. Please call back.

## 2011-12-20 MED ORDER — METOPROLOL SUCCINATE ER 50 MG PO TB24: ORAL_TABLET | ORAL | Status: DC

## 2011-12-20 NOTE — Telephone Encounter (Signed)
FU Call: Pt caretaker calling wanting to know if it is necessary for pt to take toprol XL. Pt is now able to afford medication however she doesn't know if she needs to take it. If so please call this RX into [facility] on [address].

## 2011-12-20 NOTE — Telephone Encounter (Signed)
I spoke with Pattricia Boss, the patient's caregiver, and made her aware she should continue toprol for heart rate control. I will send the prescription in to [facility] on [address].

## 2011-12-21 ENCOUNTER — Encounter (HOSPITAL_COMMUNITY): Payer: Self-pay | Admitting: *Deleted

## 2011-12-21 ENCOUNTER — Other Ambulatory Visit: Payer: Self-pay

## 2011-12-21 ENCOUNTER — Emergency Department (HOSPITAL_COMMUNITY): Payer: Medicare Other

## 2011-12-21 ENCOUNTER — Inpatient Hospital Stay (HOSPITAL_COMMUNITY)
Admission: EM | Admit: 2011-12-21 | Discharge: 2011-12-23 | DRG: 690 | Disposition: A | Discharge status: Home / Self Care | Payer: Medicare Other | Attending: Internal Medicine | Admitting: Internal Medicine

## 2011-12-21 ENCOUNTER — Encounter (HOSPITAL_COMMUNITY): Payer: Self-pay

## 2011-12-21 ENCOUNTER — Emergency Department (INDEPENDENT_AMBULATORY_CARE_PROVIDER_SITE_OTHER)
Admission: EM | Admit: 2011-12-21 | Discharge: 2011-12-21 | Disposition: A | Discharge status: Nursing Home (Includes ICF) | Payer: Medicare Other | Source: Home / Self Care | Attending: Emergency Medicine | Admitting: Emergency Medicine

## 2011-12-21 DIAGNOSIS — E860 Dehydration: Secondary | ICD-10-CM | POA: Diagnosis present

## 2011-12-21 DIAGNOSIS — I4891 Unspecified atrial fibrillation: Secondary | ICD-10-CM

## 2011-12-21 DIAGNOSIS — A498 Other bacterial infections of unspecified site: Secondary | ICD-10-CM | POA: Diagnosis present

## 2011-12-21 DIAGNOSIS — F0390 Unspecified dementia without behavioral disturbance: Secondary | ICD-10-CM | POA: Diagnosis present

## 2011-12-21 DIAGNOSIS — I359 Nonrheumatic aortic valve disorder, unspecified: Secondary | ICD-10-CM

## 2011-12-21 DIAGNOSIS — E785 Hyperlipidemia, unspecified: Secondary | ICD-10-CM | POA: Diagnosis present

## 2011-12-21 DIAGNOSIS — I472 Ventricular tachycardia, unspecified: Secondary | ICD-10-CM

## 2011-12-21 DIAGNOSIS — I10 Essential (primary) hypertension: Secondary | ICD-10-CM | POA: Diagnosis present

## 2011-12-21 DIAGNOSIS — Z79899 Other long term (current) drug therapy: Secondary | ICD-10-CM

## 2011-12-21 DIAGNOSIS — Z8679 Personal history of other diseases of the circulatory system: Secondary | ICD-10-CM

## 2011-12-21 DIAGNOSIS — M810 Age-related osteoporosis without current pathological fracture: Secondary | ICD-10-CM

## 2011-12-21 DIAGNOSIS — M129 Arthropathy, unspecified: Secondary | ICD-10-CM

## 2011-12-21 DIAGNOSIS — N390 Urinary tract infection, site not specified: Principal | ICD-10-CM

## 2011-12-21 DIAGNOSIS — I428 Other cardiomyopathies: Secondary | ICD-10-CM

## 2011-12-21 DIAGNOSIS — E782 Mixed hyperlipidemia: Secondary | ICD-10-CM

## 2011-12-21 DIAGNOSIS — I252 Old myocardial infarction: Secondary | ICD-10-CM

## 2011-12-21 DIAGNOSIS — R5383 Other fatigue: Secondary | ICD-10-CM

## 2011-12-21 DIAGNOSIS — R6251 Failure to thrive (child): Secondary | ICD-10-CM

## 2011-12-21 DIAGNOSIS — Z66 Do not resuscitate: Secondary | ICD-10-CM | POA: Diagnosis present

## 2011-12-21 DIAGNOSIS — Z95810 Presence of automatic (implantable) cardiac defibrillator: Secondary | ICD-10-CM

## 2011-12-21 DIAGNOSIS — E039 Hypothyroidism, unspecified: Secondary | ICD-10-CM

## 2011-12-21 DIAGNOSIS — R5381 Other malaise: Secondary | ICD-10-CM

## 2011-12-21 DIAGNOSIS — R42 Dizziness and giddiness: Secondary | ICD-10-CM

## 2011-12-21 HISTORY — DX: Unspecified atrial fibrillation: I48.91

## 2011-12-21 HISTORY — DX: Dizziness and giddiness: R42

## 2011-12-21 LAB — CBC
HCT: 40.6 % (ref 36.0–46.0)
HCT: 36.6 % (ref 36.0–46.0)
HCT: 32.5 % — ABNORMAL LOW (ref 36.0–46.0)
Hemoglobin: 12.1 g/dL (ref 12.0–15.0)
Hemoglobin: 10.7 g/dL — ABNORMAL LOW (ref 12.0–15.0)
MCHC: 33.1 g/dL (ref 30.0–36.0)
MCHC: 32.9 g/dL (ref 30.0–36.0)
MCV: 97.1 fL (ref 78.0–100.0)
Platelets: 240 10*3/uL (ref 150–400)
RBC: 4.18 MIL/uL (ref 3.87–5.11)
RBC: 3.79 MIL/uL — ABNORMAL LOW (ref 3.87–5.11)
RBC: 3.4 MIL/uL — ABNORMAL LOW (ref 3.87–5.11)
RDW: 16 % — ABNORMAL HIGH (ref 11.5–15.5)
WBC: 9.9 10*3/uL (ref 4.0–10.5)
WBC: 8.4 10*3/uL (ref 4.0–10.5)
WBC: 9.4 10*3/uL (ref 4.0–10.5)

## 2011-12-21 LAB — COMPREHENSIVE METABOLIC PANEL
ALT: 16 U/L (ref 0–35)
AST: 28 U/L (ref 0–37)
Albumin: 3 g/dL — ABNORMAL LOW (ref 3.5–5.2)
Alkaline Phosphatase: 55 U/L (ref 39–117)
BUN: 17 mg/dL (ref 6–23)
CO2: 27 mEq/L (ref 19–32)
CO2: 23 mEq/L (ref 19–32)
Chloride: 106 mEq/L (ref 96–112)
Chloride: 109 mEq/L (ref 96–112)
GFR calc Af Amer: 68 mL/min — ABNORMAL LOW (ref >90)
GFR calc non Af Amer: 59 mL/min — ABNORMAL LOW (ref >90)
GFR calc non Af Amer: 73 mL/min — ABNORMAL LOW (ref >90)
Glucose, Bld: 101 mg/dL — ABNORMAL HIGH (ref 70–99)
Potassium: 3.4 mEq/L — ABNORMAL LOW (ref 3.5–5.1)
Sodium: 143 mEq/L (ref 135–145)
Total Bilirubin: 0.6 mg/dL (ref 0.3–1.2)
Total Bilirubin: 0.5 mg/dL (ref 0.3–1.2)

## 2011-12-21 LAB — CREATININE, SERUM
Creatinine, Ser: 0.7 mg/dL (ref 0.50–1.10)
GFR calc Af Amer: 86 mL/min — ABNORMAL LOW (ref >90)
GFR calc non Af Amer: 74 mL/min — ABNORMAL LOW (ref >90)

## 2011-12-21 LAB — POCT URINALYSIS DIP (DEVICE)
Ketones, ur: 15 mg/dL — AB
Nitrite: POSITIVE — AB
Protein, ur: 30 mg/dL — AB
pH: 5.5 (ref 5.0–8.0)

## 2011-12-21 LAB — DIFFERENTIAL
Basophils Absolute: 0 10*3/uL (ref 0.0–0.1)
Basophils Relative: 0 % (ref 0–1)
Lymphocytes Relative: 21 % (ref 12–46)
Lymphocytes Relative: 30 % (ref 12–46)
Lymphs Abs: 2.1 10*3/uL (ref 0.7–4.0)
Lymphs Abs: 2.5 10*3/uL (ref 0.7–4.0)
Monocytes Absolute: 0.8 10*3/uL (ref 0.1–1.0)
Monocytes Relative: 9 % (ref 3–12)
Neutro Abs: 6.8 10*3/uL (ref 1.7–7.7)
Neutro Abs: 4.8 10*3/uL (ref 1.7–7.7)
Neutrophils Relative %: 58 % (ref 43–77)

## 2011-12-21 LAB — CARDIAC PANEL(CRET KIN+CKTOT+MB+TROPI)
Relative Index: INVALID (ref 0.0–2.5)
Troponin I: <0.3 ng/mL (ref <0.30)

## 2011-12-21 LAB — PROTIME-INR: Prothrombin Time: 13.1 seconds (ref 11.6–15.2)

## 2011-12-21 MED ORDER — ALUM & MAG HYDROXIDE-SIMETH 200-200-20 MG/5ML PO SUSP: 30.0000 mL | Freq: Four times a day (QID) | ORAL | Status: DC | PRN

## 2011-12-21 MED ORDER — DEXTROSE 5 % IV SOLN
1.0000 g | Freq: Once | INTRAVENOUS | Status: AC
  Administered 2011-12-21: 1 g via INTRAVENOUS
  Filled 2011-12-21: qty 10

## 2011-12-21 MED ORDER — SODIUM CHLORIDE 0.9 % IJ SOLN
INTRAMUSCULAR | Status: AC
  Filled 2011-12-21: qty 3

## 2011-12-21 MED ORDER — DOCUSATE SODIUM 100 MG PO CAPS
100.0000 mg | ORAL_CAPSULE | Freq: Two times a day (BID) | ORAL | Status: DC
  Administered 2011-12-21 – 2011-12-22 (×3): 100 mg via ORAL
  Filled 2011-12-21 (×5): qty 1

## 2011-12-21 MED ORDER — SODIUM CHLORIDE 0.9 % IV BOLUS (SEPSIS)
500.0000 mL | Freq: Once | INTRAVENOUS | Status: AC
  Administered 2011-12-21: 500 mL via INTRAVENOUS

## 2011-12-21 MED ORDER — SODIUM CHLORIDE 0.9 % IV SOLN
Freq: Once | INTRAVENOUS | Status: AC
  Administered 2011-12-21: 12:00:00 via INTRAVENOUS

## 2011-12-21 MED ORDER — ENOXAPARIN SODIUM 30 MG/0.3ML ~~LOC~~ SOLN
30.0000 mg | Freq: Every day | SUBCUTANEOUS | Status: DC
  Administered 2011-12-21 – 2011-12-22 (×2): 30 mg via SUBCUTANEOUS
  Filled 2011-12-21 (×3): qty 0.3

## 2011-12-21 MED ORDER — METOPROLOL TARTRATE 25 MG PO TABS
25.0000 mg | ORAL_TABLET | Freq: Once | ORAL | Status: DC
  Filled 2011-12-21: qty 1

## 2011-12-21 MED ORDER — DILTIAZEM HCL 100 MG IV SOLR
5.0000 mg/h | INTRAVENOUS | Status: DC
  Administered 2011-12-21: 5 mg/h via INTRAVENOUS
  Filled 2011-12-21: qty 100

## 2011-12-21 MED ORDER — METOPROLOL TARTRATE 1 MG/ML IV SOLN
2.5000 mg | Freq: Four times a day (QID) | INTRAVENOUS | Status: DC
  Administered 2011-12-21: 2.5 mg via INTRAVENOUS
  Filled 2011-12-21 (×4): qty 5

## 2011-12-21 MED ORDER — DILTIAZEM HCL 50 MG/10ML IV SOLN
10.0000 mg | Freq: Once | INTRAVENOUS | Status: AC
  Administered 2011-12-21: 10 mg via INTRAVENOUS

## 2011-12-21 MED ORDER — ACETAMINOPHEN 650 MG RE SUPP: 650.0000 mg | Freq: Four times a day (QID) | RECTAL | Status: DC | PRN

## 2011-12-21 MED ORDER — ENOXAPARIN SODIUM 40 MG/0.4ML ~~LOC~~ SOLN
40.0000 mg | Freq: Every day | SUBCUTANEOUS | Status: DC
  Filled 2011-12-21: qty 0.4

## 2011-12-21 MED ORDER — METOPROLOL TARTRATE 1 MG/ML IV SOLN
5.0000 mg | Freq: Once | INTRAVENOUS | Status: DC
  Filled 2011-12-21: qty 5

## 2011-12-21 MED ORDER — LORAZEPAM 2 MG/ML IJ SOLN
1.0000 mg | Freq: Once | INTRAMUSCULAR | Status: AC
  Administered 2011-12-21: 1 mg via INTRAVENOUS
  Filled 2011-12-21: qty 1

## 2011-12-21 MED ORDER — METOPROLOL TARTRATE 25 MG PO TABS
25.0000 mg | ORAL_TABLET | Freq: Four times a day (QID) | ORAL | Status: DC
  Administered 2011-12-21 – 2011-12-23 (×5): 25 mg via ORAL
  Filled 2011-12-21 (×10): qty 1

## 2011-12-21 MED ORDER — SODIUM CHLORIDE 0.9 % IV SOLN: 250.0000 mL | INTRAVENOUS | Status: DC | PRN

## 2011-12-21 MED ORDER — ONDANSETRON HCL 4 MG/2ML IJ SOLN: 4.0000 mg | Freq: Four times a day (QID) | INTRAMUSCULAR | Status: DC | PRN

## 2011-12-21 MED ORDER — TRAZODONE 25 MG HALF TABLET
25.0000 mg | ORAL_TABLET | Freq: Every evening | ORAL | Status: DC | PRN
  Filled 2011-12-21: qty 1

## 2011-12-21 MED ORDER — HYDROCODONE-ACETAMINOPHEN 5-325 MG PO TABS
1.0000 | ORAL_TABLET | ORAL | Status: DC | PRN
  Administered 2011-12-22: 1 via ORAL
  Filled 2011-12-21: qty 1

## 2011-12-21 MED ORDER — SODIUM CHLORIDE 0.9 % IJ SOLN: 3.0000 mL | INTRAMUSCULAR | Status: DC | PRN

## 2011-12-21 MED ORDER — MORPHINE SULFATE 2 MG/ML IJ SOLN: 1.0000 mg | INTRAMUSCULAR | Status: DC | PRN

## 2011-12-21 MED ORDER — SODIUM CHLORIDE 0.9 % IJ SOLN
3.0000 mL | Freq: Two times a day (BID) | INTRAMUSCULAR | Status: DC
  Administered 2011-12-21 – 2011-12-23 (×4): 3 mL via INTRAVENOUS

## 2011-12-21 MED ORDER — ACETAMINOPHEN 325 MG PO TABS
650.0000 mg | ORAL_TABLET | Freq: Four times a day (QID) | ORAL | Status: DC | PRN
  Administered 2011-12-23: 650 mg via ORAL
  Filled 2011-12-21: qty 2

## 2011-12-21 MED ORDER — ONDANSETRON HCL 4 MG PO TABS: 4.0000 mg | ORAL_TABLET | Freq: Four times a day (QID) | ORAL | Status: DC | PRN

## 2011-12-21 NOTE — ED Notes (Signed)
Placed  On  Stretcher  Side  Rails  Elevated   Caregivers  At  Bedside

## 2011-12-21 NOTE — H&P (Signed)
[name] [name] is an 78 y.o. female.   Chief Complaint: weakness and palpitations HPI: the pt is a very pleasant female who has a history of Atrial fibrillation who has been followed by cardiology whose medications were recently changed from Metoprolol to coreg but she never filled.  For the past few days she has been feeling weak and has been having decreased PO intake and has not been feeling well at all.  She was taken to the urgent care center today where she was found to be in Afib with RVR and was referred to the ED where she was also found to have a UTI.  Cardiology was called and recommended PO lopressor with IV doses in between as needed.  Initially the pt was started on Cardizem gtt but that has since been discontinued  Past Medical History  Diagnosis Date  . Hypertension   . MI (myocardial infarction)   . Hyperlipidemia, mixed   . Heart murmur   . Arthritis   . Osteoporosis   . Hypothyroidism   . AICD (automatic cardioverter/defibrillator) present     St. Jude    Past Surgical History  Procedure Date  . Insert / replace / remove pacemaker     ST. JUDE ICD    Family History  Problem Relation Age of Onset  . Coronary artery disease    . Stroke     Social History:  reports that she has never smoked. She has never used smokeless tobacco. She reports that she does not drink alcohol or use illicit drugs.  Allergies: No Known Allergies  Medications Prior to Admission  Medication Dose Route Frequency Provider Last Rate Last Dose  . 0.9 %  sodium chloride infusion   Intravenous Once Glynn Octave, MD 75 mL/hr at 12/21/11 1215    . cefTRIAXone (ROCEPHIN) 1 g in dextrose 5 % 50 mL IVPB  1 g Intravenous Once Glynn Octave, MD   1 g at 12/21/11 1214  . diltiazem (CARDIZEM) 100 mg in dextrose 5 % 100 mL infusion  5-15 mg/hr Intravenous Titrated Darrol Jump, PA 5 mL/hr at 12/21/11 1233 5 mg/hr at 12/21/11 1233  . diltiazem (CARDIZEM) injection SOLN 10 mg  10 mg Intravenous  Once Glynn Octave, MD   10 mg at 12/21/11 1239  . LORazepam (ATIVAN) injection 1 mg  1 mg Intravenous Once Glynn Octave, MD   1 mg at 12/21/11 1245  . metoprolol (LOPRESSOR) injection 2.5 mg  2.5 mg Intravenous Q6H Joline Salt Barrett, PA      . metoprolol (LOPRESSOR) injection 5 mg  5 mg Intravenous Once Glynn Octave, MD      . metoprolol tartrate (LOPRESSOR) tablet 25 mg  25 mg Oral Q6H Rhonda G. Barrett, PA      . sodium chloride 0.9 % bolus 500 mL  500 mL Intravenous Once Glynn Octave, MD   500 mL at 12/21/11 1226   Medications Prior to Admission  Medication Sig Dispense Refill  . Cholecalciferol (VITAMIN D3) 2000 UNITS TABS Take 1 tablet by mouth daily.       Marland Kitchen donepezil (ARICEPT) 10 MG tablet Take 10 mg by mouth at bedtime.        . levETIRAcetam (KEPPRA) 500 MG tablet Take 500 mg by mouth daily.        Marland Kitchen levothyroxine (SYNTHROID, LEVOTHROID) 50 MCG tablet Take 50 mcg by mouth daily.        . metoprolol (TOPROL-XL) 50 MG 24 hr tablet Take one tablet  by mouth daily   30 tablet  11  . multivitamin (THERAGRAN) per tablet Take 1 tablet by mouth daily.        . pregabalin (LYRICA) 25 MG capsule Take 25 mg by mouth daily.        Marland Kitchen rOPINIRole (REQUIP) 0.25 MG tablet Take 0.25 mg by mouth daily.          Results for orders placed during the hospital encounter of 12/21/11 (from the past 48 hour(s))  CBC     Status: Abnormal   Collection Time   12/21/11  2:57 PM      Component Value Range Comment   WBC 8.4  4.0 - 10.5 (K/uL)    RBC 3.79 (*) 3.87 - 5.11 (MIL/uL)    Hemoglobin 12.1  12.0 - 15.0 (g/dL)    HCT 13.0  86.5 - 78.4 (%)    MCV 96.6  78.0 - 100.0 (fL)    MCH 31.9  26.0 - 34.0 (pg)    MCHC 33.1  30.0 - 36.0 (g/dL)    RDW 69.6 (*) 29.5 - 15.5 (%)    Platelets 226  150 - 400 (K/uL)   DIFFERENTIAL     Status: Normal   Collection Time   12/21/11  2:57 PM      Component Value Range Comment   Neutrophils Relative 58  43 - 77 (%)    Neutro Abs 4.8  1.7 - 7.7 (K/uL)     Lymphocytes Relative 30  12 - 46 (%)    Lymphs Abs 2.5  0.7 - 4.0 (K/uL)    Monocytes Relative 9  3 - 12 (%)    Monocytes Absolute 0.8  0.1 - 1.0 (K/uL)    Eosinophils Relative 3  0 - 5 (%)    Eosinophils Absolute 0.3  0.0 - 0.7 (K/uL)    Basophils Relative 0  0 - 1 (%)    Basophils Absolute 0.0  0.0 - 0.1 (K/uL)   COMPREHENSIVE METABOLIC PANEL     Status: Abnormal   Collection Time   12/21/11  2:57 PM      Component Value Range Comment   Sodium 142  135 - 145 (mEq/L)    Potassium 3.4 (*) 3.5 - 5.1 (mEq/L)    Chloride 109  96 - 112 (mEq/L)    CO2 23  19 - 32 (mEq/L)    Glucose, Bld 92  70 - 99 (mg/dL)    BUN 17  6 - 23 (mg/dL)    Creatinine, Ser 2.84  0.50 - 1.10 (mg/dL)    Calcium 9.0  8.4 - 10.5 (mg/dL)    Total Protein 6.8  6.0 - 8.3 (g/dL)    Albumin 3.0 (*) 3.5 - 5.2 (g/dL)    AST 28  0 - 37 (U/L)    ALT 14  0 - 35 (U/L)    Alkaline Phosphatase 55  39 - 117 (U/L)    Total Bilirubin 0.5  0.3 - 1.2 (mg/dL)    GFR calc non Af Amer 73 (*) >90 (mL/min)    GFR calc Af Amer 85 (*) >90 (mL/min)   PROTIME-INR     Status: Normal   Collection Time   12/21/11  2:57 PM      Component Value Range Comment   Prothrombin Time 13.1  11.6 - 15.2 (seconds)    INR 0.97  0.00 - 1.49    CARDIAC PANEL(CRET KIN+CKTOT+MB+TROPI)     Status: Normal   Collection Time   12/21/11  2:58 PM      Component Value Range Comment   Total CK 88  7 - 177 (U/L)    CK, MB 3.5  0.3 - 4.0 (ng/mL)    Troponin I <0.30  <0.30 (ng/mL)    Relative Index RELATIVE INDEX IS INVALID  0.0 - 2.5     Dg Chest Portable 1 View  12/21/2011  *RADIOLOGY REPORT*  Clinical Data: Atrial fibrillation  PORTABLE CHEST - 1 VIEW  Comparison:  08/14/2011  Findings: Cardiomegaly again noted.  Single lead cardiac pacemaker is unchanged in position.  No acute infiltrate or convincing pulmonary edema.  Stable left basilar atelectasis or scarring. Thoracic spine osteopenia is noted. Vertebral plasty again noted lower thoracic spine.  Stable  compression deformity lower thoracic spine.  IMPRESSION:  No acute infiltrate or convincing pulmonary edema.  Stable left basilar atelectasis or scarring.  Original Report Authenticated By: Natasha Mead, M.D.    Review of Systems  Constitutional: Positive for malaise/fatigue. Negative for fever, chills, weight loss and diaphoresis.  HENT: Positive for hearing loss. Negative for ear pain, nosebleeds, congestion, sore throat, neck pain, tinnitus and ear discharge.   Eyes: Negative for blurred vision, double vision, photophobia, pain, discharge and redness.  Respiratory: Negative for cough, hemoptysis, sputum production, shortness of breath, wheezing and stridor.   Cardiovascular: Negative for chest pain, palpitations, orthopnea, claudication, leg swelling and PND.  Gastrointestinal: Negative for heartburn, nausea, vomiting, abdominal pain, diarrhea, constipation, blood in stool and melena.  Genitourinary: Negative for dysuria, urgency, frequency, hematuria and flank pain.  Musculoskeletal: Positive for myalgias. Negative for back pain, joint pain and falls.  Skin: Negative for itching and rash.  Neurological: Positive for weakness. Negative for dizziness, tingling, tremors, sensory change, speech change, focal weakness, seizures, loss of consciousness and headaches.  Endo/Heme/Allergies: Negative for environmental allergies and polydipsia. Does not bruise/bleed easily.  Psychiatric/Behavioral: Negative for depression, suicidal ideas, hallucinations, memory loss and substance abuse. The patient is not nervous/anxious and does not have insomnia.     Blood pressure 91/76, pulse 122, temperature 98.2 F (36.8 C), temperature source Oral, resp. rate 20, SpO2 98.00%. Physical Exam  Constitutional: She is oriented to person, place, and time. She appears well-developed and well-nourished. No distress.  HENT:  Head: Normocephalic and atraumatic.  Eyes: Conjunctivae and EOM are normal. Pupils are equal,  round, and reactive to light. Right eye exhibits no discharge. Left eye exhibits no discharge. No scleral icterus.  Neck: Normal range of motion. Neck supple. No JVD present. No tracheal deviation present. No thyromegaly present.  Cardiovascular: Exam reveals no gallop and no friction rub.   No murmur heard.      Irregular rate and tachycardic  Respiratory: Effort normal and breath sounds normal. No stridor. No respiratory distress. She has no wheezes. She has no rales. She exhibits no tenderness.  GI: Soft. Bowel sounds are normal. She exhibits no distension and no mass. There is no tenderness. There is no rebound and no guarding.  Musculoskeletal: She exhibits no edema and no tenderness.  Lymphadenopathy:    She has no cervical adenopathy.  Neurological: She is alert and oriented to person, place, and time. No cranial nerve deficit. Coordination normal.  Skin: Skin is warm and dry. No rash noted. She is not diaphoretic. No erythema. No pallor.  Psychiatric: She has a normal mood and affect.     Assessment/Plan 1.  UTI- present on admission for which the pt will get Rocephin 2.  Afib with RVR, now in  the low 100's- was on cardizem gtt and now on po metoprolol as per cardiology, will monitor closely.  The most likely cause is the UTI but also the fact that the pt did not fill her Coreg  Melene Plan 12/21/2011, 4:20 PM

## 2011-12-21 NOTE — ED Notes (Signed)
Iv  Ns  tko   Via  20  Angio  r   anticubidal       1  Att

## 2011-12-21 NOTE — Consult Note (Signed)
Consult Note  Patient ID: [name] [name] Patient ID: [name] [name] MRN: [id number], DOB/AGE: [birth year] 78 y.o. Date of Encounter: 12/21/2011  Primary Physician: Alva Garnet., MD Primary Cardiologist: SK  Chief Complaint: Atrial fib with RVR  HPI: [name] [name] is a 78 year-old female with a history of recently diagnosed atrial fibrillation. Dr. Odessa Fleming note states that this is likely a consequence of withdrawal in the amiodarone. He discontinued Coreg and started her on Toprol XL 50 mg daily. Because of concerns for insurance coverage, the office received calls regarding this prescription and the patient never started on the medication.   Her caregiver called their office yesterday for unclear reasons (possibly decreased PO intake) and today the patient was brought to the Urgent Care and then to the ER for Atrial fib with RVR.   [name] [name] is oriented to name only. She complains of LE cramping (chronic) and LE itching (chronic). The caregiver with her now does not know her well and can only tell me what she was doing over the last few days. Her PO intake has been poor. She has chronic dizziness (unchanged off amio). She denies chest pain or SOB. She does not ambulate and has to be lifted from bed to chair, etc. She uses a wheelchair for ambulation. She cooperates well enough with her caregivers to take meds and they try to encourage her to eat/drink. But, because of the insurance issues, she has been off all medications except her seizure med and Tylenol. She wears a diaper but is not chronically incontinent. She is very weak.   Past Medical History  Diagnosis Date   Atrial Fib   12/09/2011  . Hypertension   . MI (myocardial infarction) - medical therapy for 2-vessel CAD   . Hyperlipidemia, mixed   . Heart murmur - Hx AS - likely mild with preserved EF by echo  07-2011   Seizures    Dementia    Syncope with VT Storm - 2006  . Arthritis   . Osteoporosis/ OA   . Hypothyroidism   .  Polymorphic VT - s/p AICD (automatic cardioverter/defibrillator)  2006    St. Jude     Surgical History:  Past Surgical History  Procedure Date   Right hip trochanteric femoral nailing. 07/2011   Cardiac Cath: CORONARIES:  1. The left main had ostial 50% stenosis.  2. The LAD was somewhat small and did not wrap the apex. There was a proximal 50% stenosis at a septal perforator followed by a mid 80% stenosis with mid-moderate diffuse disease. The circumflex had a proximal calcified 25% stenosis. The rest of the vessel in the AV  groove was relatively normal. There was an obtuse marginal which was moderate size with approximately 25% stenosis. There was a second obtuse marginal which was large and normal.  3. The right coronary artery was a large dominant vessel. It was occluded at the ostium. The proximal segment filled via left-to-right collaterals. The distal vessel was seen to be occluded before the PDA. The PDA and posterolaterals filled extensively with left-to-right collaterals. This was a large PDA free of high-grade disease. 10/2005  . Insert / replace / remove pacemaker     ST. JUDE ICD     I have reviewed the patient's current medications. Prior to Admission:  Current Outpatient Prescriptions on File Prior to Encounter  Medication Sig Dispense Refill  . Cholecalciferol (VITAMIN D3) 2000 UNITS TABS Take 1 tablet by mouth daily.       Marland Kitchen  donepezil (ARICEPT) 10 MG tablet Take 10 mg by mouth QHS        . levETIRAcetam (KEPPRA) 500 MG tablet Take 500 mg by mouth daily.        Marland Kitchen levothyroxine (SYNTHROID, LEVOTHROID) 50 MCG tablet Take 50 mcg by mouth daily.        . metoprolol (TOPROL-XL) 50 MG 24 hr tablet Take one tablet by mouth daily  30 tablet  11  . multivitamin (THERAGRAN) per tablet Take 1 tablet by mouth daily.        . pregabalin (LYRICA) 25 MG capsule Take 25 mg by mouth daily.        Marland Kitchen rOPINIRole (REQUIP) 0.25 MG tablet Take 0.25 mg by mouth daily.         Scheduled:   .  sodium chloride   Intravenous Once  . cefTRIAXone (ROCEPHIN)  IV  1 g Intravenous Once  . diltiazem  10 mg Intravenous Once  . LORazepam  1 mg Intravenous Once  . metoprolol  5 mg Intravenous Once  . sodium chloride  500 mL Intravenous Once   Continuous:   . diltiazem (CARDIZEM) infusion 15 mg/hr (12/21/11 1233)   Allergies: FLEXERIL, BENADRYL, and DRAMAMINE  History   Social History  . Marital Status: Widowed    Spouse Name: N/A    Number of Children: N/A  . Years of Education: N/A   Occupational History  . RETIRED    Social History Main Topics  . Smoking status: Never Smoker   . Smokeless tobacco: Never Used  . Alcohol Use: No  . Drug Use: No  . Sexually Active: Not on file   Other Topics Concern  . Son is [name] [name], phone numbers [phone] or [phone]   Social History Narrative  . Lives at home with caregivers 24/7     Problem  . Coronary artery disease: Father died in his 64s of heart attack. She has siblings with coronary artery disease.  . Stroke:  Mother died in her 52s of a stroke.     Physical Exam: Blood pressure 106/75, pulse 101, temperature 98.2 F (36.8 C), temperature source Oral, resp. rate 18, SpO2 97.00%. General: Frail, elderly white female, anxious but not in acute pain. Head: Normocephalic, atraumatic, sclera non-icteric, no xanthomas, nares are without discharge.  Neck: Negative for carotid bruits. JVD not elevated. No thyromegally Lungs: basilar rales, left > right, but no rhonchi. Breathing is unlabored. Heart: Irregular with S1 S2. Possible soft murmur (difficult to assess secondary to rapid rate) but no rub or gallop appreciated. Abdomen: Soft, non-tender, non-distended with normoactive bowel sounds. No hepatomegaly. No rebound/guarding. No obvious abdominal masses. Pt resists palpation Msk:  Strength and tone appear generally weak. Extremities: No clubbing or cyanosis. No edema.  Distal pedal pulses are 1+ in both lower  extremities and 2+ both radials  But equal bilaterally. Neuro: Alert and oriented X 1. Moves all extremities spontaneously. No focal deficits noted. Psych:  Responds to verbal but not oriented and answers not always responsive Skin: No rashes or lesions noted, some excoriations (mild)  Review of Systems:  Answers she gave reported above, but ROS otherwise unobtainable/unreliable secondary to pt mental status.   Labs:  Lab Results  Component Value Date   WBC 9.9 12/21/2011   HGB 13.1 12/21/2011   HCT 40.6 12/21/2011   MCV 97.1 12/21/2011   PLT 240 12/21/2011    Lab 12/21/11 1034  NA 143  K 3.7  CL 106  CO2 27  BUN 20  CREATININE 0.85  CALCIUM 10.1  PROT 7.6  BILITOT 0.6  ALKPHOS 63  ALT 16  AST 28  GLUCOSE 101*   Radiology/Studies:  Dg Chest Portable 1 View 12/21/2011  *RADIOLOGY REPORT*  Clinical Data: Atrial fibrillation  PORTABLE CHEST - 1 VIEW  Comparison:  08/14/2011  Findings: Cardiomegaly again noted.  Single lead cardiac pacemaker is unchanged in position.  No acute infiltrate or convincing pulmonary edema.  Stable left basilar atelectasis or scarring. Thoracic spine osteopenia is noted. Vertebral plasty again noted lower thoracic spine.  Stable compression deformity lower thoracic spine.   IMPRESSION:  No acute infiltrate or convincing pulmonary edema.  Stable left basilar atelectasis or scarring.  Original Report Authenticated By: Natasha Mead, M.D.     EKG: Sinus tach with episodes of PAF/RVR  ASSESSMENT AND PLAN:  1. paroxysmal atrial fibrillation with rapid ventricular response: The patient is currently on IV Cardizem but has pulled her IV out once and is at high risk to do this again. Therefore, we feel that a more appropriate drug is by mouth metoprolol. She can have 25 mg every 6 hours. We will also add when necessary IV metoprolol which can be used as a supplement. Once the by mouth medication is started we will wean and discontinue the Cardizem drip. Rate control is  appropriate.   Signed, Bjorn Loser Barrett PA-C 12/21/2011, 2:14 PM   I have seen, examined the patient, and reviewed the above assessment and plan.  Briefly, the patient is an elderly female with advanced dementia referred to ER by PCP for exclusion of dehydration /UTI given recent decreased PO intake and relative failure to thrive.  Though she has afib with RVR, she is asymptomatic at this time. I would recommend rate control with metoprolol as above. She would be a poor candidate for anticoagulation. I have spoke with her son who is in agreement with DNI/DNR, though he would like to keep ICD therapies programmed ON.  He is also in agreement with palliative care consultation (by primary team) to determine best options long term. Given her advanced age and dementia, I think that this is very reasonable.  Will ask medicine to evaluate and admit the patient. Possible discharge in 1-2 days.  Co Sign: Hillis Range, MD 12/21/2011 4:03 PM

## 2011-12-21 NOTE — ED Notes (Signed)
Pt  Placed  On  Cardiac  Monitor  Nasal  02  At  2  l  /  Min   Pt  To  Be  Transferred  To  [facility]

## 2011-12-21 NOTE — ED Notes (Signed)
Pt presents to ED from Urgent care. Pt presents to ED in afib rvr. Pt alert at baseline

## 2011-12-21 NOTE — ED Provider Notes (Signed)
History     CSN: [id number]  Arrival date & time 12/21/11  1137   First MD Initiated Contact with Patient 12/21/11 1158      Chief Complaint  Patient presents with  . Atrial Fibrillation    (Consider location/radiation/quality/duration/timing/severity/associated sxs/prior treatment) HPI Comments: Patient presents from urgent care with initial fibrillation rapid ventricular response. She brought in by her caretakers with one week of weakness, and rundown, poor appetite. She's had pain with urination as well. She is a history to fibrillation is on Coumadin. She recently saw Dr. Graciela Husbands was taken off of her carvedilol and put on metoprolol.  No chest pain, vomiting, diarrhea, fever, abdominal pain, shortness of breath.  History limited by dementia  The history is provided by the patient, a relative and a caregiver.    Past Medical History  Diagnosis Date  . Hypertension   . MI (myocardial infarction)   . Hyperlipidemia, mixed   . Heart murmur   . Arthritis   . Osteoporosis   . Hypothyroidism   . AICD (automatic cardioverter/defibrillator) present     St. Jude    Past Surgical History  Procedure Date  . Insert / replace / remove pacemaker     ST. JUDE ICD    Family History  Problem Relation Age of Onset  . Coronary artery disease    . Stroke      History  Substance Use Topics  . Smoking status: Never Smoker   . Smokeless tobacco: Never Used  . Alcohol Use: No    OB History    Grav Para Term Preterm Abortions TAB SAB Ect Mult Living                  Review of Systems  Unable to perform ROS: Dementia    Allergies  Review of patient's allergies indicates no known allergies.  Home Medications   No current outpatient prescriptions on file.  BP 104/78  Pulse 112  Temp(Src) 98.2 F (36.8 C) (Oral)  Resp 20  SpO2 98%  Physical Exam  Constitutional: She appears well-developed and well-nourished. No distress.  HENT:  Head: Normocephalic and atraumatic.    Mouth/Throat: Oropharynx is clear and moist. No oropharyngeal exudate.  Eyes: Conjunctivae are normal. Pupils are equal, round, and reactive to light.  Neck: Normal range of motion.  Cardiovascular: Normal rate and normal heart sounds.        irregularly irregular  Pulmonary/Chest: Effort normal and breath sounds normal. No respiratory distress.  Abdominal: Soft. There is no tenderness. There is no rebound and no guarding.  Musculoskeletal: Normal range of motion. She exhibits no edema and no tenderness.  Neurological: She is alert. No cranial nerve deficit.       Follows commands, moves all extremities freely  Skin: Skin is warm.    ED Course  Procedures (including critical care time)  Labs Reviewed  CBC - Abnormal; Notable for the following:    RBC 3.79 (*)    RDW 15.8 (*)    All other components within normal limits  COMPREHENSIVE METABOLIC PANEL - Abnormal; Notable for the following:    Potassium 3.4 (*)    Albumin 3.0 (*)    GFR calc non Af Amer 73 (*)    GFR calc Af Amer 85 (*)    All other components within normal limits  DIFFERENTIAL  PROTIME-INR  CARDIAC PANEL(CRET KIN+CKTOT+MB+TROPI)  URINALYSIS, ROUTINE W REFLEX MICROSCOPIC   Dg Chest Portable 1 View  12/21/2011  *RADIOLOGY REPORT*  Clinical Data: Atrial fibrillation  PORTABLE CHEST - 1 VIEW  Comparison:  08/14/2011  Findings: Cardiomegaly again noted.  Single lead cardiac pacemaker is unchanged in position.  No acute infiltrate or convincing pulmonary edema.  Stable left basilar atelectasis or scarring. Thoracic spine osteopenia is noted. Vertebral plasty again noted lower thoracic spine.  Stable compression deformity lower thoracic spine.  IMPRESSION:  No acute infiltrate or convincing pulmonary edema.  Stable left basilar atelectasis or scarring.  Original Report Authenticated By: Natasha Mead, M.D.     1. Atrial fibrillation with rapid ventricular response   2. Urinary tract infection   3. Failure to thrive        MDM  Atrial fibrillation with rapid ventricular response, recent change in medications, UTI.  Seems as though patient has not been taking her lopressor.  IVF, cardizem, labs, UA.  UTI here treated with rocephin  D/w cardiology, patient of Dr. Graciela Husbands, last seen on 12/21.  Agree with rate control.  Request medicine admission    Date: 12/21/2011  Rate: 128  Rhythm: atrial fibrillation  QRS Axis: left  Intervals: normal  ST/T Wave abnormalities: nonspecific ST/T changes  Conduction Disutrbances:left anterior fascicular block  Narrative Interpretation:   Old EKG Reviewed: changes noted CRITICAL CARE Performed by: Glynn Octave   Total critical care time: 30  Critical care time was exclusive of separately billable procedures and treating other patients.  Critical care was necessary to treat or prevent imminent or life-threatening deterioration.  Critical care was time spent personally by me on the following activities: development of treatment plan with patient and/or surrogate as well as nursing, discussions with consultants, evaluation of patient's response to treatment, examination of patient, obtaining history from patient or surrogate, ordering and performing treatments and interventions, ordering and review of laboratory studies, ordering and review of radiographic studies, pulse oximetry and re-evaluation of patient's condition.        Glynn Octave, MD 12/21/11 [phone]

## 2011-12-21 NOTE — ED Provider Notes (Signed)
Chief Complaint  Patient presents with  . Weakness    History of Present Illness:  [name] is 78 year old female with multiple medical problems. She lives at home with a couple caretakers. Over the past week she's been weak, tired, rundown, not eating or drinking well. She's complained of some dizziness and some leg cramps. She also has a slight cough. She admits to some pain with urination and has a history of urinary tract infection. She has hypertension, history of myocardial infarction, atrial fibrillation, and is on multiple meds. She has not had any fever, chills, nasal congestion, sore throat, shortness of breath, chest pain, syncope, abdominal pain, nausea, vomiting, diarrhea, blood in stool, or extremity pain or swelling. She is nonambulatory and is in a wheelchair most of the time.  Review of Systems:  Other than noted above, the patient denies any of the following symptoms. Systemic:  No fever, chills, sweats, fatigue, myalgias, headache, or anorexia. Eye:  No redness, pain or drainage. ENT:  No earache, nasal congestion, rhinorrhea, sinus pressure, or sore throat. Lungs:  No cough, sputum production, wheezing, shortness of breath.  Cardiovascular:  No chest pain, palpitations, or syncope. GI:  No nausea, vomiting, abdominal pain or diarrhea. GU:  No dysuria, frequency, or hematuria. Skin:  No rash or pruritis.  PMFSH:  Past medical history, family history, social history, meds, and allergies were reviewed.  Physical Exam:   Vital signs:  BP 102/66  Pulse 125  Temp(Src) 97.7 F (36.5 C) (Oral)  Resp 20  SpO2 98% General:  Alert, in no distress.  She is pleasantly confused, she does answer some questions. Eye:  PERRL, full EOMs.  Lids and conjunctivas were normal. ENT:  TMs and canals were normal, without erythema or inflammation.  Nasal mucosa was clear and uncongested, without drainage.  Mucous membranes were moist.  Pharynx was clear, without exudate or drainage.   There were no oral ulcerations or lesions. Neck:  Supple, no adenopathy, tenderness or mass. Thyroid was normal. Lungs:  No respiratory distress.  Lungs were clear to auscultation, without wheezes, rales or rhonchi.  Breath sounds were clear and equal bilaterally. Heart: She has an irregularly irregular rhythm which is somewhat rapid, without gallops, murmers or rubs. Abdomen:  Soft, flat, and non-tender to palpation.  No hepatosplenomagaly or mass. Skin:  Clear, warm, and dry, without rash or lesions.  Labs:   Results for orders placed during the hospital encounter of 12/21/11  POCT URINALYSIS DIP (DEVICE)      Component Value Range   Glucose, UA NEGATIVE  NEGATIVE (mg/dL)   Bilirubin Urine SMALL (*) NEGATIVE    Ketones, ur 15 (*) NEGATIVE (mg/dL)   Specific Gravity, Urine >=1.030  1.005 - 1.030    Hgb urine dipstick TRACE (*) NEGATIVE    pH 5.5  5.0 - 8.0    Protein, ur 30 (*) NEGATIVE (mg/dL)   Urobilinogen, UA 0.2  0.0 - 1.0 (mg/dL)   Nitrite POSITIVE (*) NEGATIVE    Leukocytes, UA SMALL (*) NEGATIVE      Radiology:  No results found.  EKG:   Date: 12/21/2011  Rate: 137  Rhythm: atrial fibrillation  QRS Axis: left  Intervals: normal  ST/T Wave abnormalities: normal  Conduction Disutrbances:none  Narrative Interpretation: Atrial fibrillation with rapid ventricular response, left axis deviation, septal infarct, age undetermined.  Old EKG Reviewed: none available   Medications given in UCC:  None.  Assessment:   Diagnoses that have been ruled out:  Diagnoses that  are still under consideration:  Final diagnoses:  Fatigue  Urinary tract infection  Atrial fibrillation with rapid ventricular response    Plan:   1.  She will be transferred to the ED by [facility]. 2.  CBC and CMET are pending.  3.  IV was inserted.   Roque Lias, MD 12/21/11 [phone]

## 2011-12-21 NOTE — ED Notes (Signed)
[id number] Ready

## 2011-12-21 NOTE — ED Notes (Signed)
Pt  Has  Symptoms  Of  Weakness      Decreased   Appetite           Advised to  Come  To  ucc  By  pcp  To  Be  Checked  For  Uti/  Dehydration           Pt  Has  Sitters  At  Home   Who    Came  With pt    She  Is   Awake        Seems  Oriented  For  Age

## 2011-12-22 ENCOUNTER — Encounter (HOSPITAL_COMMUNITY): Payer: Self-pay | Admitting: Internal Medicine

## 2011-12-22 LAB — CBC
HCT: 31.5 % — ABNORMAL LOW (ref 36.0–46.0)
MCH: 31.5 pg (ref 26.0–34.0)
MCV: 96.3 fL (ref 78.0–100.0)
Platelets: 209 10*3/uL (ref 150–400)
RDW: 15.9 % — ABNORMAL HIGH (ref 11.5–15.5)

## 2011-12-22 LAB — COMPREHENSIVE METABOLIC PANEL
ALT: 12 U/L (ref 0–35)
AST: 21 U/L (ref 0–37)
Albumin: 2.6 g/dL — ABNORMAL LOW (ref 3.5–5.2)
Alkaline Phosphatase: 48 U/L (ref 39–117)
Chloride: 109 mEq/L (ref 96–112)
Potassium: 3.3 mEq/L — ABNORMAL LOW (ref 3.5–5.1)
Sodium: 140 mEq/L (ref 135–145)
Total Bilirubin: 0.6 mg/dL (ref 0.3–1.2)
Total Protein: 6 g/dL (ref 6.0–8.3)

## 2011-12-22 MED ORDER — DEXTROSE 5 % IV SOLN
1.0000 g | INTRAVENOUS | Status: DC
  Administered 2011-12-23: 1 g via INTRAVENOUS
  Filled 2011-12-22 (×2): qty 10

## 2011-12-22 NOTE — Progress Notes (Signed)
Subjective: This lady seems to be fairly stable. The ventricular rate is better controlled but she has become somewhat hypotensive with metoprolol doses. She has moderate to severe dementia.           Physical Exam: Blood pressure 90/58, pulse 60, temperature 98.8 F (37.1 C), temperature source Oral, resp. rate 18, weight 45.5 kg (100 lb 5 oz), SpO2 98.00%. She looks systemically well currently, blood pressure low. Heart sounds are present and appear to be in atrial fibrillation. Lung fields are clear. She is not clinically in heart failure.   Investigations:     Basic Metabolic Panel:  Basename 12/22/11 0545 12/21/11 2118 12/21/11 1457  NA 140 -- 142  K 3.3* -- 3.4*  CL 109 -- 109  CO2 23 -- 23  GLUCOSE 72 -- 92  BUN 12 -- 17  CREATININE 0.71 0.70 --  CALCIUM 8.3* -- 9.0  MG -- -- --  PHOS -- -- --   Liver Function Tests:  Basename 12/22/11 0545 12/21/11 1457  AST 21 28  ALT 12 14  ALKPHOS 48 55  BILITOT 0.6 0.5  PROT 6.0 6.8  ALBUMIN 2.6* 3.0*     CBC:  Basename 12/22/11 0545 12/21/11 2118 12/21/11 1457 12/21/11 1034  WBC 8.0 9.4 -- --  NEUTROABS -- -- 4.8 6.8  HGB 10.3* 10.7* -- --  HCT 31.5* 32.5* -- --  MCV 96.3 95.6 -- --  PLT 209 216 -- --    Dg Chest Portable 1 View  12/21/2011  *RADIOLOGY REPORT*  Clinical Data: Atrial fibrillation  PORTABLE CHEST - 1 VIEW  Comparison:  08/14/2011  Findings: Cardiomegaly again noted.  Single lead cardiac pacemaker is unchanged in position.  No acute infiltrate or convincing pulmonary edema.  Stable left basilar atelectasis or scarring. Thoracic spine osteopenia is noted. Vertebral plasty again noted lower thoracic spine.  Stable compression deformity lower thoracic spine.  IMPRESSION:  No acute infiltrate or convincing pulmonary edema.  Stable left basilar atelectasis or scarring.  Original Report Authenticated By: Natasha Mead, M.D.      Medications: I have reviewed the patient's current  medications.  Impression: 1. Atrial fibrillation with rapid ventricular response, now better rate control. 2. Ventricular tachycardia-polymorphic. Status post AICD. 3. Chronic dizziness. 4. Moderate to severe dementia.     Plan: 1. Reduce beta blocker dose in view of her soft blood pressure. 2. I've spoken with her son, who lives in [place], who agrees with nonaggressive measures at this point in her life overall. I told him that the cardiologist Dr. Graciela Husbands  would likely be calling him to discuss further. 3. Disposition-possible discharge home tomorrow if blood pressure improves.     LOS: 1 day   Wilson Singer Pager [phone]  12/22/2011, 11:13 AM

## 2011-12-22 NOTE — Progress Notes (Signed)
Utilization review completed. Marche Hottenstein Watson 12/22/2011 

## 2011-12-22 NOTE — Progress Notes (Signed)
Rivaroxaban  Patient Name: [name] [name]      SUBJECTIVE: dizzy ( a chronic complaint) and in general not well but without specific complaints  Past Medical History  Diagnosis Date  . Hypertension   . MI (myocardial infarction)   . Hyperlipidemia, mixed   . Heart murmur   . Arthritis   . Osteoporosis   . Hypothyroidism   . AICD (automatic cardioverter/defibrillator) present     St. Jude  . Dizziness     chronic  thought in past 2/2 amio but i suspect  neurological   . Campath-induced atrial fibrillation     paroxysmal    PHYSICAL EXAM Filed Vitals:   12/21/11 1845 12/21/11 2106 12/21/11 2232 12/22/11 0443  BP: 85/53 98/67 116/71 110/71  Pulse: 74 78 90 87  Temp: 97.9 F (36.6 C) 97.7 F (36.5 C)  98.8 F (37.1 C)  TempSrc: Oral Oral  Oral  Resp: 16 18  18   Weight:      SpO2: 100% 97%  98%    Alert not  oriented in no acute distress; sleepy with cap on  HENT- normal Eyes- EOMI, without scleral icterus Skin warm and dry Neck- supple without thyromegaly, JVP-flat, carotids brisk and full without bruits Lungs-clear to auscultation CV-irregular Abd-soft with active bowel sounds; no midline pulsation or hepatomegaly Pulses-min palpable]] MKS-without gross deformity Neuro- Ax O x1 , CN3-12 intact, grossly normal motor and sensory function Affect flat   TELEMETRY: Reviewed telemetry pt in sinus with runs of atrial tachycardia   Intake/Output Summary (Last 24 hours) at 12/22/11 0848 Last data filed at 12/22/11 0158  Gross per 24 hour  Intake      3 ml  Output      1 ml  Net      2 ml    LABS: Basic Metabolic Panel:  Lab 12/22/11 2130 12/21/11 2118 12/21/11 1457 12/21/11 1034  NA 140 -- 142 143  K 3.3* -- 3.4* 3.7  CL 109 -- 109 106  CO2 23 -- 23 27  GLUCOSE 72 -- 92 101*  BUN 12 -- 17 20  CREATININE 0.71 0.70 0.73 0.85  CALCIUM 8.3* -- 9.0 --  MG -- -- -- --  PHOS -- -- -- --   Cardiac Enzymes:  Basename 12/21/11 1458  CKTOTAL 88  CKMB 3.5    CKMBINDEX --  TROPONINI <0.30   CBC:  Lab 12/22/11 0545 12/21/11 2118 12/21/11 1457 12/21/11 1034  WBC 8.0 9.4 8.4 9.9  NEUTROABS -- -- 4.8 6.8  HGB 10.3* 10.7* 12.1 13.1  HCT 31.5* 32.5* 36.6 40.6  MCV 96.3 95.6 96.6 97.1  PLT 209 216 226 240   PROTIME:  Basename 12/21/11 1457  LABPROT 13.1  INR 0.97   Liver Function Tests:  Basename 12/22/11 0545 12/21/11 1457  AST 21 28  ALT 12 14  ALKPHOS 48 55  BILITOT 0.6 0.5  PROT 6.0 6.8  ALBUMIN 2.6* 3.0*     ASSESSMENT AND PLAN:  Patient Active Hospital Problem List:  Dehydration and weakness   DIZZINESS, CHRONIC (08/17/2009)    ventricular tachycardia-polymorphic (12/08/2011)   Automatic implantable cardiac defibrillator ST judes (12/08/2011)   A-fib (12/21/2011)   WOuld continue metoprolol for rate control;  i will try and talk with son also about ICD  Would be inclined with progressive dementia to inactivate tachy therapies    Signed, Sherryl Manges MD  12/22/2011

## 2011-12-23 LAB — BASIC METABOLIC PANEL
BUN: 13 mg/dL (ref 6–23)
Calcium: 8.8 mg/dL (ref 8.4–10.5)
GFR calc Af Amer: 84 mL/min — ABNORMAL LOW (ref >90)
GFR calc non Af Amer: 72 mL/min — ABNORMAL LOW (ref >90)
Glucose, Bld: 81 mg/dL (ref 70–99)
Potassium: 3.4 mEq/L — ABNORMAL LOW (ref 3.5–5.1)
Sodium: 140 mEq/L (ref 135–145)

## 2011-12-23 LAB — URINE CULTURE

## 2011-12-23 LAB — CBC
MCH: 31.6 pg (ref 26.0–34.0)
MCHC: 33 g/dL (ref 30.0–36.0)
RDW: 15.5 % (ref 11.5–15.5)

## 2011-12-23 MED ORDER — METOPROLOL SUCCINATE ER 50 MG PO TB24: ORAL_TABLET | ORAL | Status: DC

## 2011-12-23 MED ORDER — CEFUROXIME AXETIL 500 MG PO TABS: 500.0000 mg | ORAL_TABLET | Freq: Two times a day (BID) | ORAL | Status: DC

## 2011-12-23 MED ORDER — METOPROLOL SUCCINATE ER 50 MG PO TB24
50.0000 mg | ORAL_TABLET | Freq: Every day | ORAL | Status: DC
  Administered 2011-12-23: 50 mg via ORAL
  Filled 2011-12-23: qty 1

## 2011-12-23 MED ORDER — CIPROFLOXACIN HCL 500 MG PO TABS
500.0000 mg | ORAL_TABLET | Freq: Two times a day (BID) | ORAL | Status: DC
  Administered 2011-12-23: 500 mg via ORAL
  Filled 2011-12-23 (×2): qty 1

## 2011-12-23 NOTE — Progress Notes (Signed)
12/23/2011 4:22 PM Nursing Note: Discharge AVS form, medications already taken today and those still due this evening explained and rx given to pt. Caregiver "Katie". Follow up appointments discussed and when to call the doctor also reviewed. Pt. Iv d/c. D/c tele. D/c home with 24/7 caregiver per orders. Caregiver voiced concern about not having all medications described on AVS form however had several un-picked up RX at pt. Pharmacy. MD made aware. Pt. Caregiver to pick up unclaimed Rx tonight along with new RX and voice any concerns to primary MD.  Kelby Fam, Blanchard Kelch

## 2011-12-23 NOTE — Progress Notes (Signed)
12/23/2011 5:07 PM Nursing Note:  Spoke with Pt. Son to inform him of patient d/c home with caregiver. Questions addressed.  Zeno Hickel, Blanchard Kelch

## 2011-12-23 NOTE — Discharge Summary (Signed)
PATIENT DETAILS Name: [name] [name] Age: 78 y.o. Sex: female Date of Birth: Aug 22, 1919 MRN: [id number]. Admit Date: 12/21/2011 Admitting Physician: Jeoffrey Massed JYN:WGNFAOZ,HYQMVHQI R., MD  PRIMARY DISCHARGE DIAGNOSIS:  Active Problems:   A-fib with RVR Urinary tract infection     PAST MEDICAL HISTORY: Past Medical History  Diagnosis Date  . Hypertension   . MI (myocardial infarction)   . Hyperlipidemia, mixed   . Heart murmur   . Arthritis   . Osteoporosis   . Hypothyroidism   . AICD (automatic cardioverter/defibrillator) present     St. Jude  . Dizziness     chronic  thought in past 2/2 amio but i suspect  neurological   . Campath-induced atrial fibrillation     paroxysmal    DISCHARGE MEDICATIONS: Current Discharge Medication List    START taking these medications   Details  cefUROXime (CEFTIN) 500 MG tablet Take 1 tablet (500 mg total) by mouth 2 (two) times daily. Qty: 6 tablet, Refills: 0      CONTINUE these medications which have CHANGED   Details  metoprolol (TOPROL-XL) 50 MG 24 hr tablet Take one tablet by mouth daily  Qty: 30 tablet, Refills: 0   Associated Diagnoses: Other primary cardiomyopathies; Automatic implantable cardiac defibrillator in situ; Paroxysmal ventricular tachycardia      CONTINUE these medications which have NOT CHANGED   Details  Cholecalciferol (VITAMIN D3) 2000 UNITS TABS Take 1 tablet by mouth daily.     donepezil (ARICEPT) 10 MG tablet Take 10 mg by mouth at bedtime.      levETIRAcetam (KEPPRA) 500 MG tablet Take 500 mg by mouth daily.      levothyroxine (SYNTHROID, LEVOTHROID) 50 MCG tablet Take 50 mcg by mouth daily.      multivitamin (THERAGRAN) per tablet Take 1 tablet by mouth daily.     pregabalin (LYRICA) 25 MG capsule Take 25 mg by mouth daily.      rOPINIRole (REQUIP) 0.25 MG tablet Take 0.25 mg by mouth daily.           BRIEF HPI:  See H&P, Labs, Consult and Test reports for all details in brief,  patient was admitted for weakness and palpiations. For further details please see the history and physical that was done on admission.  CONSULTATIONS:   cardiology  PERTINENT RADIOLOGIC STUDIES: Dg Chest Portable 1 View  12/21/2011  *RADIOLOGY REPORT*  Clinical Data: Atrial fibrillation  PORTABLE CHEST - 1 VIEW  Comparison:  08/14/2011  Findings: Cardiomegaly again noted.  Single lead cardiac pacemaker is unchanged in position.  No acute infiltrate or convincing pulmonary edema.  Stable left basilar atelectasis or scarring. Thoracic spine osteopenia is noted. Vertebral plasty again noted lower thoracic spine.  Stable compression deformity lower thoracic spine.  IMPRESSION:  No acute infiltrate or convincing pulmonary edema.  Stable left basilar atelectasis or scarring.  Original Report Authenticated By: Natasha Mead, M.D.     PERTINENT LAB RESULTS: CBC:  Basename 12/23/11 0530 12/22/11 0545  WBC 6.8 8.0  HGB 10.6* 10.3*  HCT 32.1* 31.5*  PLT 200 209   CMET CMP     Component Value Date/Time   NA 140 12/23/2011 0530   K 3.4* 12/23/2011 0530   CL 107 12/23/2011 0530   CO2 19 12/23/2011 0530   GLUCOSE 81 12/23/2011 0530   BUN 13 12/23/2011 0530   CREATININE 0.76 12/23/2011 0530   CALCIUM 8.8 12/23/2011 0530   PROT 6.0 12/22/2011 0545   ALBUMIN 2.6* 12/22/2011 0545  AST 21 12/22/2011 0545   ALT 12 12/22/2011 0545   ALKPHOS 48 12/22/2011 0545   BILITOT 0.6 12/22/2011 0545   GFRNONAA 72* 12/23/2011 0530   GFRAA 84* 12/23/2011 0530    GFR Estimated Creatinine Clearance: 31.2 ml/min (by C-G formula based on Cr of 0.76). No results found for this basename: LIPASE:2,AMYLASE:2 in the last 72 hours  Basename 12/21/11 1458  CKTOTAL 88  CKMB 3.5  CKMBINDEX --  TROPONINI <0.30   No components found with this basename: POCBNP:3 No results found for this basename: DDIMER:2 in the last 72 hours No results found for this basename: HGBA1C:2 in the last 72 hours No results found for this basename:  CHOL:2,HDL:2,LDLCALC:2,TRIG:2,CHOLHDL:2,LDLDIRECT:2 in the last 72 hours No results found for this basename: TSH,T4TOTAL,FREET3,T3FREE,THYROIDAB in the last 72 hours No results found for this basename: VITAMINB12:2,FOLATE:2,FERRITIN:2,TIBC:2,IRON:2,RETICCTPCT:2 in the last 72 hours Coags:  Basename 12/21/11 1457  INR 0.97   Microbiology: Recent Results (from the past 240 hour(s))  URINE CULTURE     Status: Normal   Collection Time   12/21/11 10:34 AM      Component Value Range Status Comment   Specimen Description URINE, CLEAN CATCH   Final    Special Requests NONE   Final    Setup Time [id number]   Final    Colony Count >=100,000 COLONIES/ML   Final    Culture ESCHERICHIA COLI   Final    Report Status 12/23/2011 FINAL   Final    Organism ID, Bacteria ESCHERICHIA COLI   Final      BRIEF HOSPITAL COURSE:   Active Problems:  A-fib-with RVR -Patient was admitted to the hospitalist service, she was initially on a Cardizem infusion however that was quickly tapered off and patient was then transitioned to oral beta blockers. She is not a candidate for Coumadin therapy. Cardiology was consulted.Marland Kitchen She was then seen by Dr. Clide Cliff and Dr. Johney Frame from the EP service. Over the past 24 hours patient has maintained a sinus rhythm. Current plans are to discharge the patient on 50 mg of Toprol. I have spoken with Dr. Clide Cliff today and per him it was okay to discharge the patient if her heart rate was doing fine with 50 mg of Toprol-XL. Patient will followup with her primary care practitioner and with Dr. Clide Cliff as indicated below. Patient has AICD in place, there was a question of whether to turn this off during this admission, I spoke with Dr. Clide Cliff today, and per him when Dr. Johney Frame initially saw this patient he spoke with the patient's son, and it was felt that for now we'll leave on the AICD and make this decision at a later date.  UTI -Patient's urine culture grew pansensitive Escherichia coli.  Patient was maintained on Rocephin, she will be transitioned over to Ceftin.  H/O ventricular tachycardia-polymorphic -Patient has AICD in place. Please see discussion above  DIZZINESS, CHRONIC -This is a chronic issue for the patient. Per her caregiver at bedside she is currently nonambulatory.  Rest of the patient's medical condition is stable.   Patient is a DO NOT RESUSCITATE   TODAY-DAY OF DISCHARGE:  Subjective:   [name] [name] today has no headache,no chest abdominal pain,no new weakness tingling or numbness, feels much better. Her meds and a sinus rhythm today.  Objective:   Blood pressure 108/68, pulse 102, temperature 97.5 F (36.4 C), temperature source Oral, resp. rate 18, height 5\' 3"  (1.6 m), weight 42.3 kg (93 lb 4.1 oz), SpO2 94.00%.  Intake/Output  Summary (Last 24 hours) at 12/23/11 1443 Last data filed at 12/23/11 0924  Gross per 24 hour  Intake    650 ml  Output      0 ml  Net    650 ml    Exam Awake Alert at times,  No new F.N deficits, Normal affect .AT,PERRAL Supple Neck,No JVD, No cervical lymphadenopathy appriciated.  Symmetrical Chest wall movement, Good air movement bilaterally, CTAB RRR,No Gallops,Rubs or new Murmurs, No Parasternal Heave +ve B.Sounds, Abd Soft, Non tender, No organomegaly appriciated, No rebound -guarding or rigidity. No Cyanosis, Clubbing or edema, No new Rash or bruise  DISPOSITION: Home-patient has 24/7 caregivers  DISCHARGE INSTRUCTIONS:    Follow-up Information    Follow up with SHELTON,KIMBERLY R.. Make an appointment in 1 week.      Follow up with Sherryl Manges, MD. Make an appointment in 2 weeks.   Contact information:   [address] [place] [address] [phone]         Total Time spent on discharge equals 45 minutes.  SignedJeoffrey Massed 12/23/2011 2:43 PM

## 2011-12-26 NOTE — ED Notes (Signed)
Urine C and S: >100,000 colonies E. Coli.  Pt. was transferred to ED and admitted to the hosp. Treated with Ceftin BID x 3 days on 1/5. Lab and tx. shown to Dr. Lorenz Coaster and he said tx. Adequate. [name] [name] 12/26/2011

## 2012-01-01 ENCOUNTER — Ambulatory Visit (INDEPENDENT_AMBULATORY_CARE_PROVIDER_SITE_OTHER): Payer: Medicare Other | Admitting: Physician Assistant

## 2012-01-01 ENCOUNTER — Encounter: Payer: Self-pay | Admitting: Physician Assistant

## 2012-01-01 VITALS — BP 116/60 | HR 79 | Resp 18 | Ht 62.0 in | Wt 94.0 lb

## 2012-01-01 DIAGNOSIS — I4891 Unspecified atrial fibrillation: Secondary | ICD-10-CM

## 2012-01-01 DIAGNOSIS — I2510 Atherosclerotic heart disease of native coronary artery without angina pectoris: Secondary | ICD-10-CM

## 2012-01-01 DIAGNOSIS — I472 Ventricular tachycardia: Secondary | ICD-10-CM

## 2012-01-01 NOTE — Patient Instructions (Signed)
Your physician recommends that you schedule a follow-up appointment in: 6 weeks with Dr Graciela Husbands

## 2012-01-01 NOTE — Assessment & Plan Note (Signed)
She appears to be maintaining sinus rhythm.  Her PCP recently decreased her Toprol from 50 to 25 mg a day.  She is not a Coumadin candidate.  Continue current regimen.  Followup with Dr. Sherryl Manges in 6-8 weeks.

## 2012-01-01 NOTE — Progress Notes (Signed)
[address] [place], [place]  [address] Phone: [phone] Fax:  [phone]  Date:  01/01/2012   Name:  [name] [name]       DOB:  1919-05-28 MRN:  [id number]  PCP:  Dr. Renae Gloss  Primary Cardiologist:  Dr. Sherryl Manges  Primary Electrophysiologist:  Dr. Sherryl Manges    History of Present Illness: [name] [name] is a 78 y.o. female who presents for post hospital follow up.  She has a history of polymorphic ventricular tachycardia, status post AICD, 2 vessel CAD which is not amenable to PCI and has been treated medically.  Left heart catheterization 11/06: Ostial left main 50%, proximal LAD 50%, mid 80%, proximal circumflex 25%, obtuse marginal 25%, ostial RCA occluded with left to right collaterals.  Echocardiogram 11/06: EF 55-65%, mild to moderate AS, mean gradient 20 mm of mercury, mild MAC.  She was recently seen by Dr. Graciela Husbands.  Amiodarone had been discontinued secondary to dizziness.  She was in atrial fibrillation when seen in the office 12/21.  Toprol was added to her regimen.  She was then admitted earlier this month with atrial fibrillation with rapid ventricular rate.  This was also in the context of a UTI which was treated.  She was seen by our team in the hospital.  She was not felt to be a Coumadin candidate.  Rate control was recommended.  She was deemed a DNI/DNR.  It was decided to keep her tachycardia therapies on at that time by the family.  Dr. Graciela Husbands planned to discuss this with the family due to her advanced age and dementia.  Her caregiver is with her today who provides the history.  There have been no complaints of chest pain or shortness of breath.  There have been no complaints of palpitations.  She has chronic dizziness.  There's been no change.  There have been no complaints of syncope.  Past Medical History  Diagnosis Date  . Hypertension   . MI (myocardial infarction)   . Hyperlipidemia, mixed   . Heart murmur   . Arthritis   .  Osteoporosis   . Hypothyroidism   . AICD (automatic cardioverter/defibrillator) present     St. Jude  . Dizziness     chronic  thought in past 2/2 amio but i suspect  neurological   . Atrial fibrillation     paroxysmal    Current Outpatient Prescriptions  Medication Sig Dispense Refill  . levETIRAcetam (KEPPRA) 500 MG tablet Take 500 mg by mouth daily.        Marland Kitchen levothyroxine (SYNTHROID, LEVOTHROID) 50 MCG tablet Take 50 mcg by mouth daily.        . Magnesium 250 MG TABS Take 1 tablet by mouth daily.      . metoprolol (TOPROL-XL) 50 MG 24 hr tablet Take one tablet by mouth daily   30 tablet  0  . multivitamin (THERAGRAN) per tablet Take 1 tablet by mouth daily.       . Cholecalciferol (VITAMIN D3) 2000 UNITS TABS Take 1 tablet by mouth daily.       Marland Kitchen donepezil (ARICEPT) 10 MG tablet Take 10 mg by mouth at bedtime.        . pregabalin (LYRICA) 25 MG capsule Take 25 mg by mouth daily.        Marland Kitchen rOPINIRole (REQUIP) 0.25 MG tablet Take 0.25 mg by mouth daily.          Allergies: No Known Allergies  History  Substance Use Topics  . Smoking status: Never Smoker   . Smokeless tobacco: Never Used  . Alcohol Use: No     PHYSICAL EXAM: VS:  BP 116/60  Pulse 79  Resp 18  Ht 5\' 2"  (1.575 m)  Wt 94 lb (42.638 kg)  BMI 17.19 kg/m2 Well nourished, well developed, in no acute distress HEENT: normal Neck: no JVD at 90 degrees Cardiac:  normal S1, S2; RRR; 2/6 systolic murmur RUSB Lungs:  clear to auscultation bilaterally, no wheezing, rhonchi or rales Abd: soft, nontender  Ext: no edema Skin: warm and dry Neuro:  CNs 2-12 intact, no focal abnormalities noted Psych: pleasant  EKG:   Increased artifact, rhythm appears to be sinus with a heart rate of 79  ASSESSMENT AND PLAN:

## 2012-01-01 NOTE — Assessment & Plan Note (Signed)
I will bring her back in 6-8 weeks to see Dr. Sherryl Manges as the discussion regarding +/- discontinuation of her ICD therapies appears to be pending.

## 2012-02-13 ENCOUNTER — Encounter: Payer: Self-pay | Admitting: Internal Medicine

## 2012-02-13 ENCOUNTER — Ambulatory Visit (INDEPENDENT_AMBULATORY_CARE_PROVIDER_SITE_OTHER): Payer: Medicare Other | Admitting: Internal Medicine

## 2012-02-13 DIAGNOSIS — Z95810 Presence of automatic (implantable) cardiac defibrillator: Secondary | ICD-10-CM

## 2012-02-13 DIAGNOSIS — I472 Ventricular tachycardia: Secondary | ICD-10-CM

## 2012-02-13 DIAGNOSIS — I428 Other cardiomyopathies: Secondary | ICD-10-CM

## 2012-02-13 DIAGNOSIS — I4891 Unspecified atrial fibrillation: Secondary | ICD-10-CM

## 2012-02-13 LAB — ICD DEVICE OBSERVATION
CHARGE TIME: 10.6 s
RV LEAD IMPEDENCE ICD: 410 Ohm
TZAT-0001SLOWVT: 1
TZAT-0004SLOWVT: 8
TZAT-0012SLOWVT: 200 ms
TZAT-0013SLOWVT: 2
TZST-0001SLOWVT: 3
TZST-0001SLOWVT: 4
TZST-0001SLOWVT: 5
TZST-0003SLOWVT: 25 J
TZST-0003SLOWVT: 36 J
VENTRICULAR PACING ICD: 1 pct

## 2012-02-13 MED ORDER — METOPROLOL SUCCINATE ER 50 MG PO TB24: 50.0000 mg | ORAL_TABLET | Freq: Every day | ORAL | Status: AC

## 2012-02-13 NOTE — Assessment & Plan Note (Addendum)
No intercurrent vt  icd is at eol  We will not replace it

## 2012-02-13 NOTE — Assessment & Plan Note (Signed)
recverded

## 2012-02-13 NOTE — Assessment & Plan Note (Signed)
As above.

## 2012-02-13 NOTE — Assessment & Plan Note (Signed)
RVR  In the past  Will increase toprol 50

## 2012-02-13 NOTE — Progress Notes (Signed)
  HPI  [name] [name] is a 78 y.o. female  is seen in followup for polymorphic ventricular tachycardia and syncope for which she takes amiodarone. She has a nonischemic cardiomyopathy. With normalization of LV function She continues to complain of dizziness.   The patient denies SOB, chest pain, edema or palpitations     Past Medical History  Diagnosis Date  . Hypertension   . MI (myocardial infarction)   . Hyperlipidemia, mixed   . Heart murmur   . Arthritis   . Osteoporosis   . Hypothyroidism   . AICD (automatic cardioverter/defibrillator) present     St. Jude  . Dizziness     chronic  thought in past 2/2 amio but i suspect  neurological   . Atrial fibrillation     paroxysmal    Past Surgical History  Procedure Date  . Insert / replace / remove pacemaker     [employer]. JUDE ICD    Current Outpatient Prescriptions  Medication Sig Dispense Refill  . acetaminophen (TYLENOL) 500 MG tablet Take 500 mg by mouth every 6 (six) hours as needed.      . levETIRAcetam (KEPPRA) 500 MG tablet Take 500 mg by mouth daily.        Marland Kitchen levothyroxine (SYNTHROID, LEVOTHROID) 50 MCG tablet Take 50 mcg by mouth daily.        . Magnesium 250 MG TABS Take 1 tablet by mouth daily.      . mirtazapine (REMERON) 15 MG tablet Take 15 mg by mouth at bedtime.      . multivitamin (THERAGRAN) per tablet Take 1 tablet by mouth daily.       . pregabalin (LYRICA) 25 MG capsule Take 25 mg by mouth daily.         BP 122/68  Pulse 67  Wt 100 lb (45.36 kg)  No Known Allergies  Review of Systems negative except from HPI and PMH  Physical Exam Well developed and nourished in no acute distress HENT normal Neck supple with JVP-flat Carotids brisk and full without bruits Clear Regular rate and rhythm, no murmurs or gallops Abd-soft   No Clubbing cyanosis edema Skin-warm and dry     Assessment and  Plan

## 2012-02-13 NOTE — Patient Instructions (Signed)
Your physician has recommended you make the following change in your medication: Increase your metoprolol to 50 mg; take one tablet every day.

## 2012-05-01 ENCOUNTER — Emergency Department: Payer: Self-pay | Admitting: Unknown Physician Specialty

## 2012-05-01 LAB — CBC
HCT: 35.2 % (ref 35.0–47.0)
MCHC: 33.6 g/dL (ref 32.0–36.0)
MCV: 96 fL (ref 80–100)
Platelet: 266 10*3/uL (ref 150–440)
RDW: 13.5 % (ref 11.5–14.5)
WBC: 9 10*3/uL (ref 3.6–11.0)

## 2012-05-01 LAB — COMPREHENSIVE METABOLIC PANEL
Albumin: 2.9 g/dL — ABNORMAL LOW (ref 3.4–5.0)
Alkaline Phosphatase: 74 U/L (ref 50–136)
BUN: 20 mg/dL — ABNORMAL HIGH (ref 7–18)
Bilirubin,Total: 0.3 mg/dL (ref 0.2–1.0)
Co2: 30 mmol/L (ref 21–32)
Creatinine: 0.84 mg/dL (ref 0.60–1.30)
EGFR (African American): >60

## 2012-05-01 LAB — CK TOTAL AND CKMB (NOT AT ARMC)
CK, Total: 64 U/L (ref 21–215)
CK-MB: 1.2 ng/mL (ref 0.5–3.6)

## 2012-05-01 LAB — TROPONIN I: Troponin-I: <0.02 ng/mL

## 2013-01-18 ENCOUNTER — Ambulatory Visit: Payer: Self-pay | Admitting: Internal Medicine

## 2013-01-31 ENCOUNTER — Emergency Department: Payer: Self-pay | Admitting: Emergency Medicine

## 2013-01-31 LAB — BASIC METABOLIC PANEL
Calcium, Total: 9.2 mg/dL (ref 8.5–10.1)
Chloride: 109 mmol/L — ABNORMAL HIGH (ref 98–107)
Co2: 22 mmol/L (ref 21–32)
Creatinine: 1.08 mg/dL (ref 0.60–1.30)
EGFR (African American): 52 — ABNORMAL LOW
EGFR (Non-African Amer.): 45 — ABNORMAL LOW
Glucose: 75 mg/dL (ref 65–99)
Osmolality: 299 (ref 275–301)
Potassium: 4.4 mmol/L (ref 3.5–5.1)
Sodium: 144 mmol/L (ref 136–145)

## 2013-01-31 LAB — CBC
HCT: 40.1 % (ref 35.0–47.0)
HGB: 13.2 g/dL (ref 12.0–16.0)
MCH: 31.9 pg (ref 26.0–34.0)
MCV: 97 fL (ref 80–100)
RBC: 4.16 10*6/uL (ref 3.80–5.20)

## 2013-01-31 LAB — TROPONIN I: Troponin-I: <0.02 ng/mL

## 2013-02-06 ENCOUNTER — Inpatient Hospital Stay: Payer: Self-pay | Admitting: Internal Medicine

## 2013-02-06 ENCOUNTER — Emergency Department: Payer: Self-pay | Admitting: Emergency Medicine

## 2013-02-06 LAB — CBC
HGB: 13.9 g/dL (ref 12.0–16.0)
MCH: 31.1 pg (ref 26.0–34.0)
MCHC: 32.4 g/dL (ref 32.0–36.0)
MCV: 96 fL (ref 80–100)
RBC: 4.46 10*6/uL (ref 3.80–5.20)
WBC: 15.8 10*3/uL — ABNORMAL HIGH (ref 3.6–11.0)

## 2013-02-06 LAB — URINALYSIS, COMPLETE
Bilirubin,UR: NEGATIVE
Glucose,UR: NEGATIVE mg/dL (ref 0–75)
Protein: NEGATIVE
Specific Gravity: 1.023 (ref 1.003–1.030)
Squamous Epithelial: NONE SEEN

## 2013-02-06 LAB — COMPREHENSIVE METABOLIC PANEL
Alkaline Phosphatase: 69 U/L (ref 50–136)
BUN: 26 mg/dL — ABNORMAL HIGH (ref 7–18)
Bilirubin,Total: 0.5 mg/dL (ref 0.2–1.0)
Glucose: 91 mg/dL (ref 65–99)
Potassium: 3.7 mmol/L (ref 3.5–5.1)
SGOT(AST): 31 U/L (ref 15–37)
SGPT (ALT): 20 U/L (ref 12–78)

## 2013-02-07 LAB — BASIC METABOLIC PANEL
Anion Gap: 5 — ABNORMAL LOW (ref 7–16)
Chloride: 112 mmol/L — ABNORMAL HIGH (ref 98–107)
Co2: 32 mmol/L (ref 21–32)
EGFR (African American): >60
Sodium: 149 mmol/L — ABNORMAL HIGH (ref 136–145)

## 2013-02-07 LAB — WBC: WBC: 11.2 10*3/uL — ABNORMAL HIGH (ref 3.6–11.0)

## 2013-02-08 LAB — BASIC METABOLIC PANEL WITH GFR
Anion Gap: 9
BUN: 11 mg/dL
Calcium, Total: 8.7 mg/dL
Chloride: 110 mmol/L — ABNORMAL HIGH
Co2: 26 mmol/L
Creatinine: 0.82 mg/dL
EGFR (African American): >60
EGFR (Non-African Amer.): >60
Glucose: 132 mg/dL — ABNORMAL HIGH
Osmolality: 290
Potassium: 3.8 mmol/L
Sodium: 145 mmol/L

## 2013-02-08 LAB — URINE CULTURE

## 2013-02-08 LAB — MAGNESIUM: Magnesium: 2.4 mg/dL

## 2013-02-08 LAB — WBC: WBC: 10.4 10*3/uL (ref 3.6–11.0)

## 2013-02-10 DIAGNOSIS — I4891 Unspecified atrial fibrillation: Secondary | ICD-10-CM

## 2013-02-10 LAB — CBC WITH DIFFERENTIAL/PLATELET
Basophil #: 0.1 10*3/uL (ref 0.0–0.1)
Basophil %: 1 %
Eosinophil #: 0.2 10*3/uL (ref 0.0–0.7)
Eosinophil %: 2.8 %
HGB: 12.9 g/dL (ref 12.0–16.0)
Lymphocyte %: 25.2 %
MCV: 95 fL (ref 80–100)
Monocyte #: 0.7 x10 3/mm (ref 0.2–0.9)
Monocyte %: 9.1 %
RBC: 4.11 10*6/uL (ref 3.80–5.20)
RDW: 13.8 % (ref 11.5–14.5)

## 2013-02-10 LAB — COMPREHENSIVE METABOLIC PANEL
Albumin: 2.9 g/dL — ABNORMAL LOW (ref 3.4–5.0)
Anion Gap: 6 — ABNORMAL LOW (ref 7–16)
BUN: 5 mg/dL — ABNORMAL LOW (ref 7–18)
Bilirubin,Total: 0.5 mg/dL (ref 0.2–1.0)
Chloride: 108 mmol/L — ABNORMAL HIGH (ref 98–107)
Creatinine: 0.8 mg/dL (ref 0.60–1.30)
Glucose: 90 mg/dL (ref 65–99)
Osmolality: 278 (ref 275–301)
SGOT(AST): 37 U/L (ref 15–37)
Total Protein: 7.4 g/dL (ref 6.4–8.2)

## 2013-02-10 LAB — TROPONIN I
Troponin-I: 0.02 ng/mL
Troponin-I: 0.02 ng/mL

## 2013-02-12 LAB — BASIC METABOLIC PANEL
Anion Gap: 8 (ref 7–16)
Co2: 25 mmol/L (ref 21–32)
Glucose: 99 mg/dL (ref 65–99)
Osmolality: 276 (ref 275–301)

## 2013-02-12 LAB — PLATELET COUNT: Platelet: 261 10*3/uL (ref 150–440)

## 2013-02-15 ENCOUNTER — Ambulatory Visit: Payer: Self-pay | Admitting: Internal Medicine

## 2013-03-03 IMAGING — CT CT ABD-PELV W/ CM
2 of 5 series · 17 of 46 positions shown, 19 images · IV contrast (APPLIED)
Comparison: None.

CLINICAL DATA: Found on floor.  Pain.  Altered mental status

CT ABDOMEN AND PELVIS WITH CONTRAST
TECHNIQUE: Multidetector CT imaging of the abdomen and pelvis was
performed following the standard protocol during bolus
administration of intravenous contrast.
Contrast: 80 ml Cmnipaque-G99 IV

[Series 2: abd/pelv with 5.0 b31f st · axial · 0.69mm/px · z∈[-316,+24]mm · 14 of 76 slices shown, 16 images]
[im 4/76  soft-tissue]
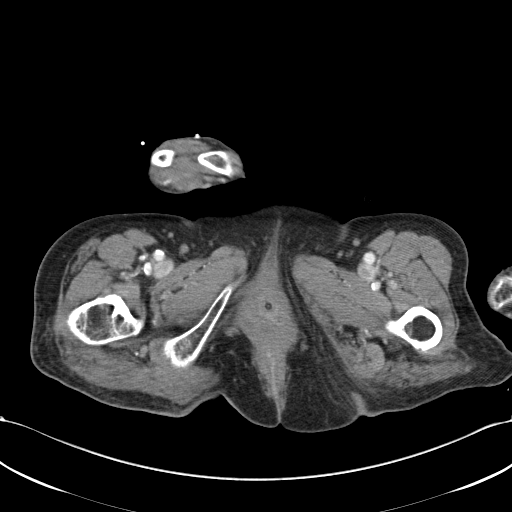
[im 4/76  bone]
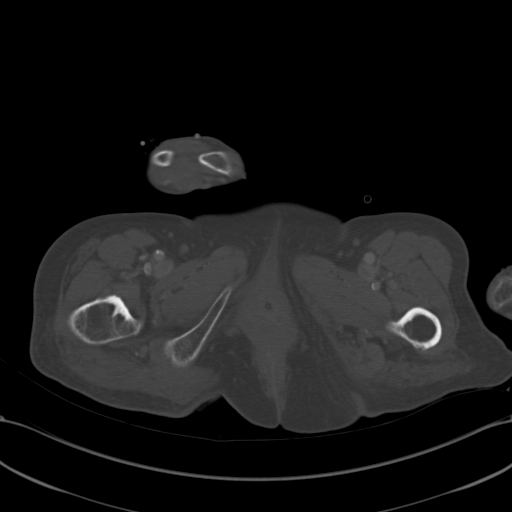
[im 8/76  soft-tissue]
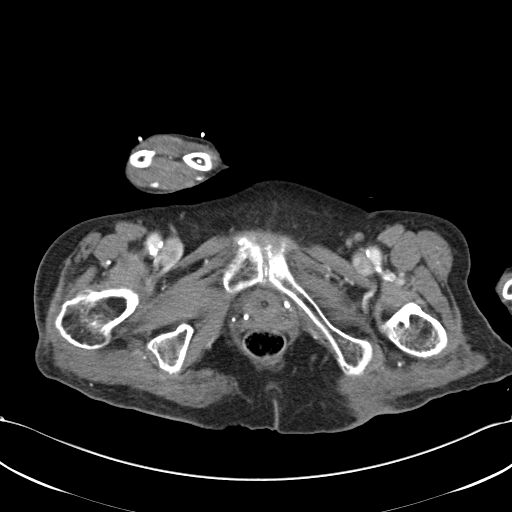
[im 16/76  soft-tissue]
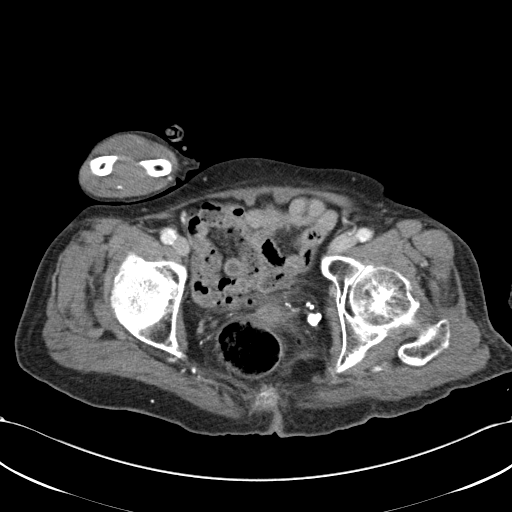
[im 20/76  soft-tissue]
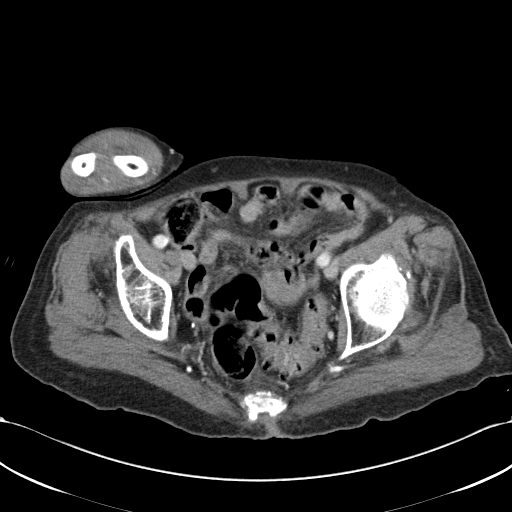
[im 24/76  soft-tissue]
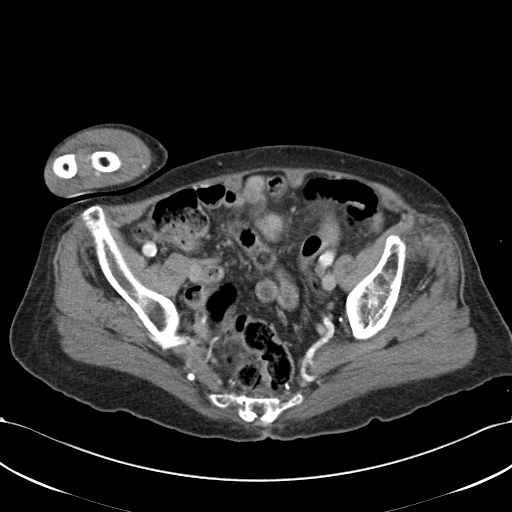
[im 32/76  soft-tissue]
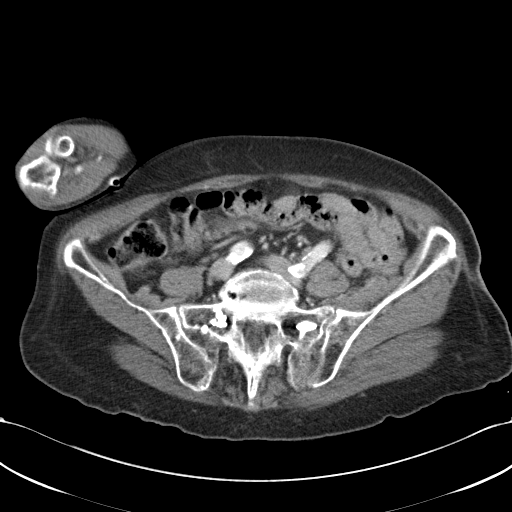
[im 36/76  soft-tissue]
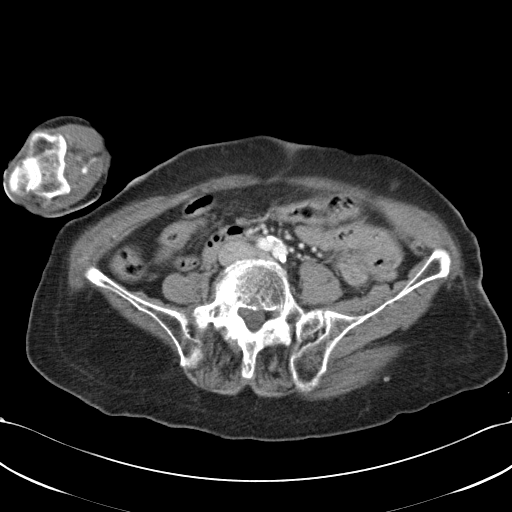
[im 40/76  soft-tissue]
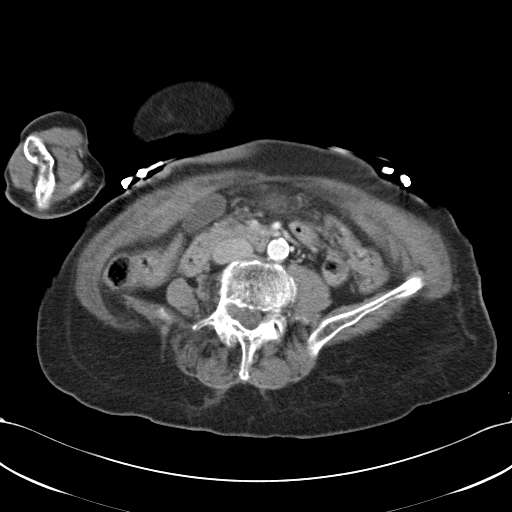
[im 44/76  soft-tissue]
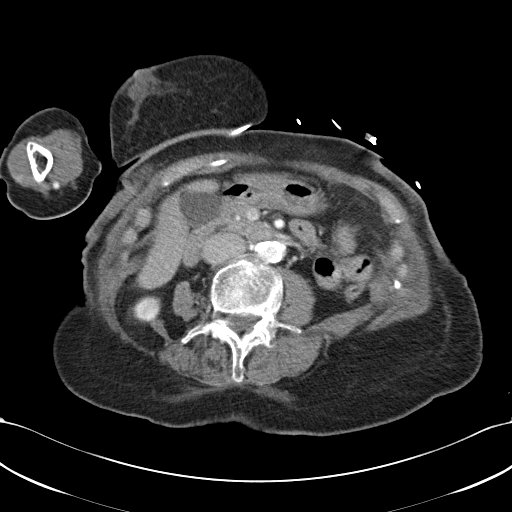
[im 44/76  bone]
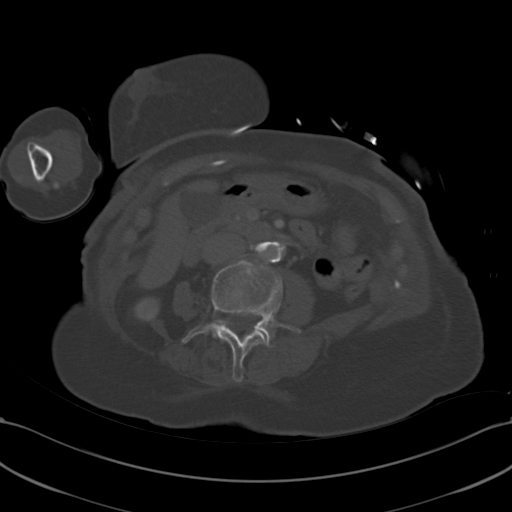
[im 52/76  soft-tissue]
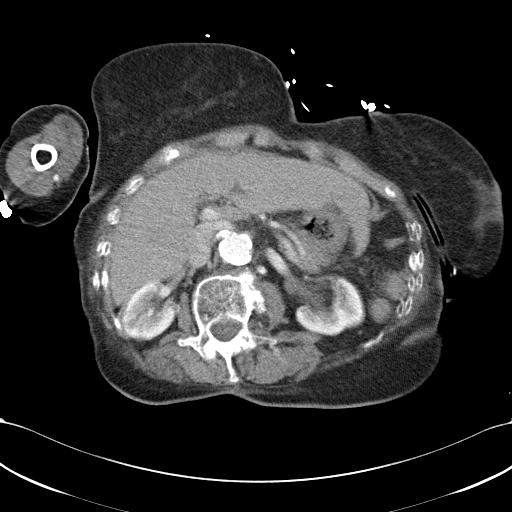
[im 56/76  soft-tissue]
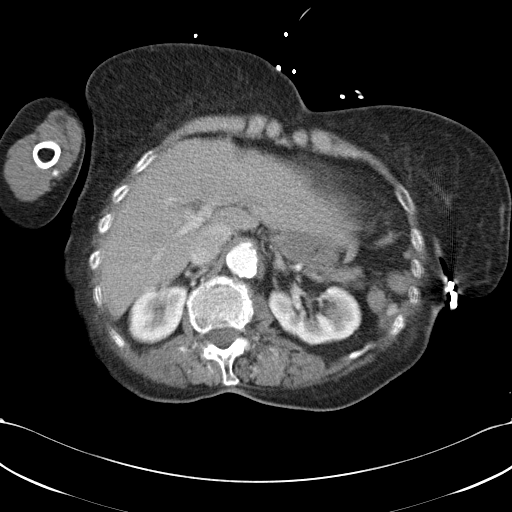
[im 60/76  soft-tissue]
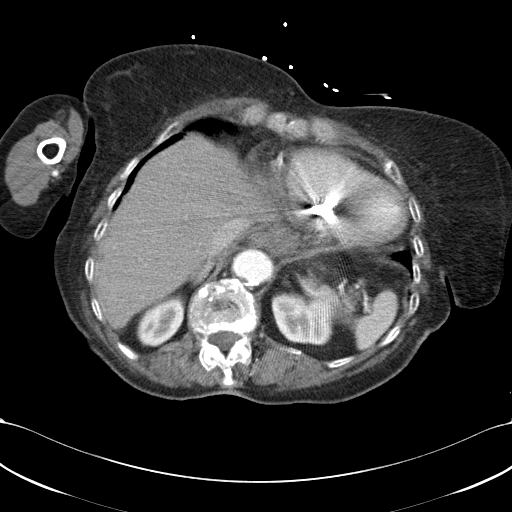
[im 68/76  soft-tissue]
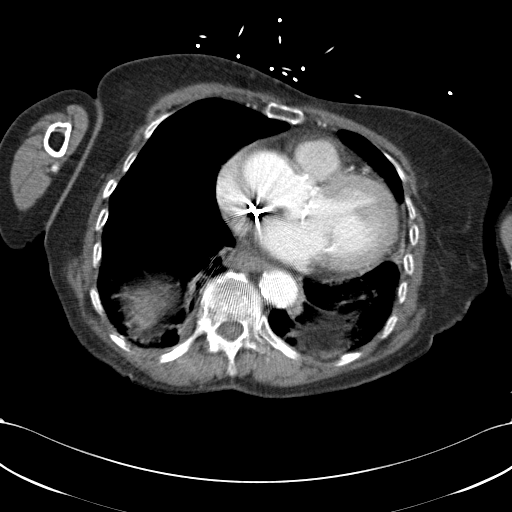
[im 72/76  soft-tissue]
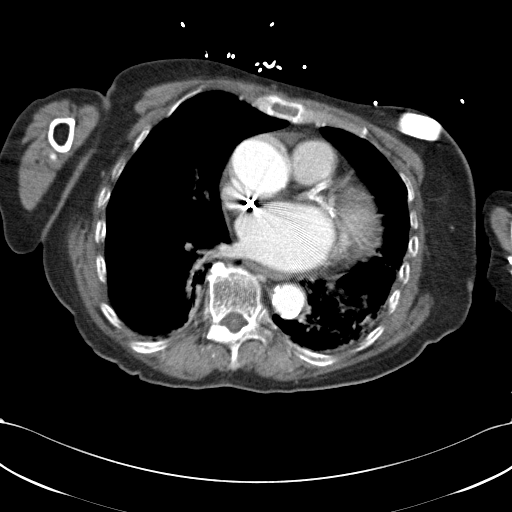

[Series 5: abd/pelv with 2.0 spo cor st · coronal · 0.90mm/px · 3 of 92 slices shown]
[im 31/92  soft-tissue]
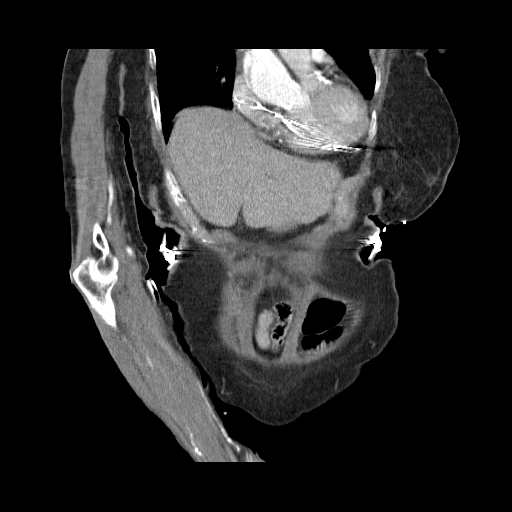
[im 41/92  soft-tissue]
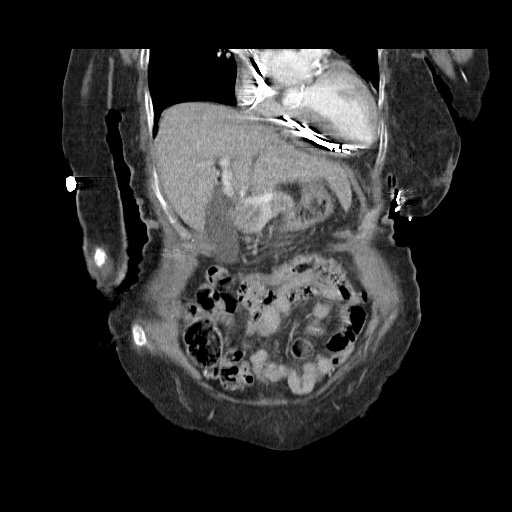
[im 51/92  soft-tissue]
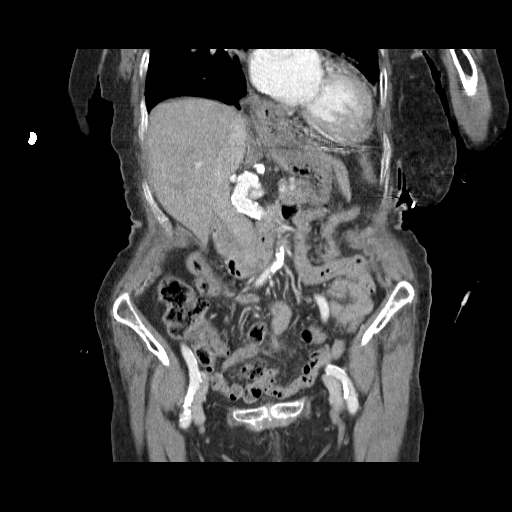

[17 of 46 positions shown; findings below may reference images not displayed]

FINDINGS: Left lower lobe airspace disease, atelectasis versus
pneumonia.  Mild right lower lobe atelectasis.  Coronary artery
calcification.  AICD in the right ventricle.

The liver and gallbladder and bile ducts are normal.  Pancreas and
spleen are normal.  Kidneys show no obstruction or mass.

Advanced atherosclerotic disease in the aorta and iliac and
visceral arteries.  No aneurysm.

Negative for bowel obstruction or bowel thickening.  No abscess or
free fluid.  Foley catheter decompresses the urinary bladder.

Multiple thoracic and lumbar fractures.  Cement vertebroplasty T9.
Mild fracture of T11 and moderate fracture T12 unchanged from the
CT 10/23/2007.  Fractures of L1, L2 and L4 are unchanged from 5889.
No acute fracture. Prior cement vertebroplasty in the sacrum
bilaterally.
IMPRESSION: Bibasilar airspace disease left than right.  This may be due to
atelectasis or infiltrate.

No acute abnormality in the abdomen.

Multiple spinal fractures are present which appear chronic and
unchanged from prior study.

## 2013-03-03 IMAGING — CT CT HEAD W/O CM
1 of 3 series · 15 of 30 positions shown, 19 images · non-contrast
Comparison: 08/22/2010 and 08/19/2010.

CLINICAL DATA: Altered mental status.

CT HEAD WITHOUT CONTRAST
TECHNIQUE: Contiguous axial images were obtained from the base of
the skull through the vertex without intravenous contrast.

[Series 2: head trauma 4.8 h37s · axial · 0.43mm/px · z∈[+247,+373]mm · 15 of 30 slices shown, 19 images]
[im 2/30  brain]
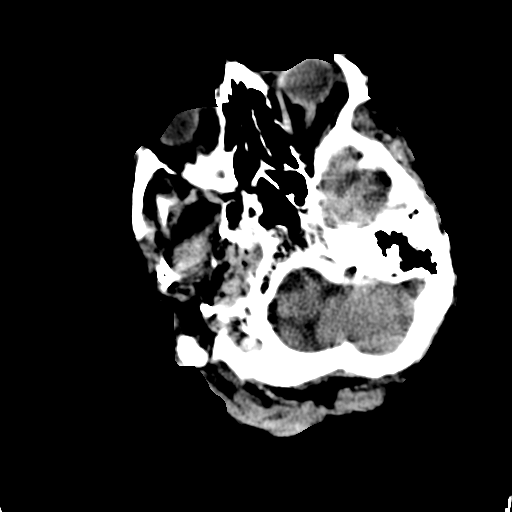
[im 2/30  bone]
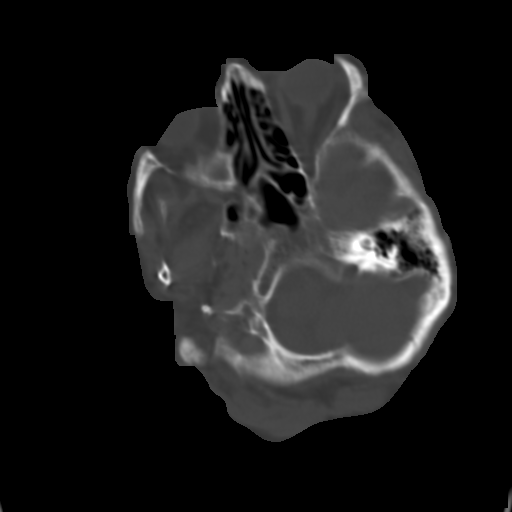
[im 4/30  brain]
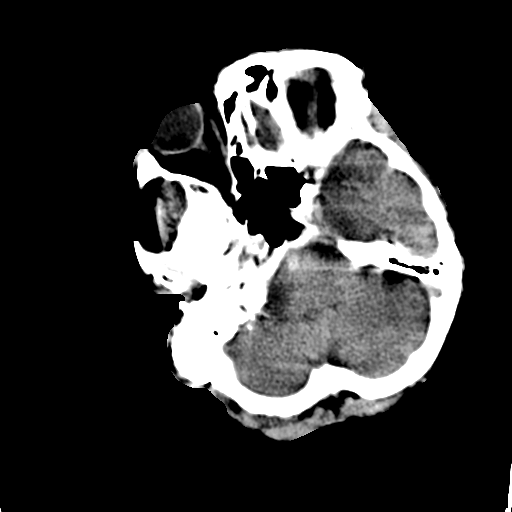
[im 6/30  brain]
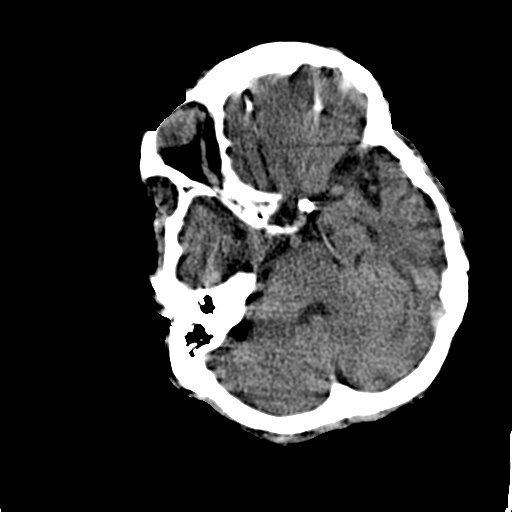
[im 7/30  brain]
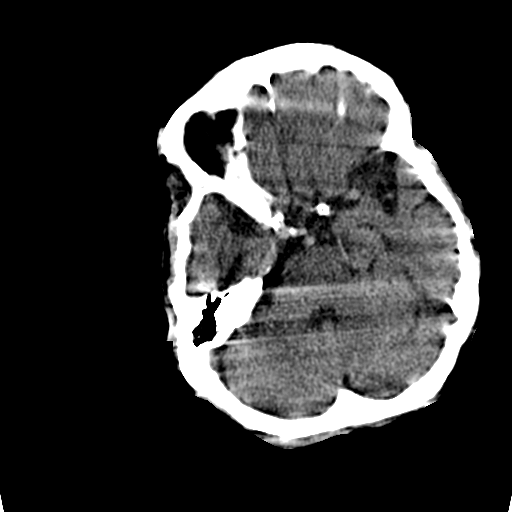
[im 9/30  brain]
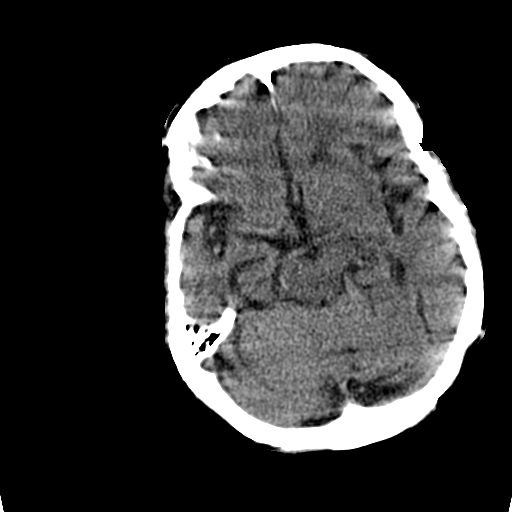
[im 9/30  bone]
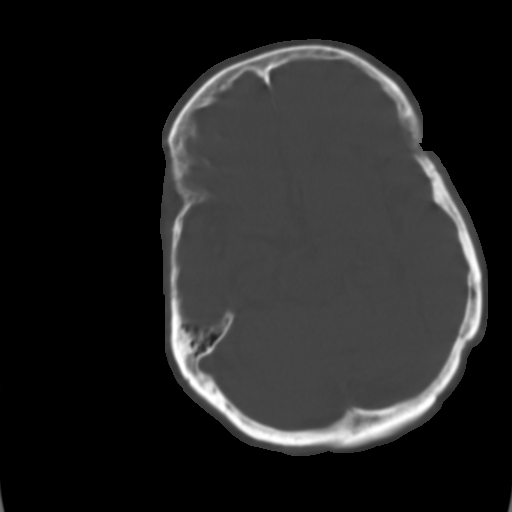
[im 11/30  brain]
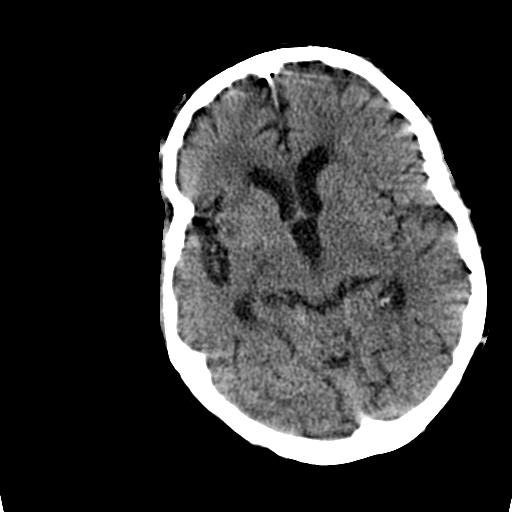
[im 12/30  brain]
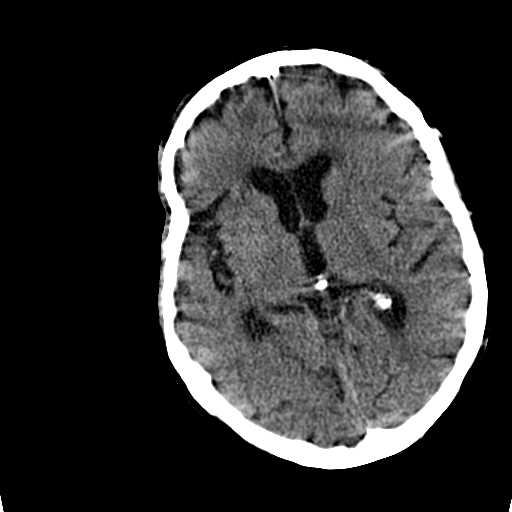
[im 16/30  brain]
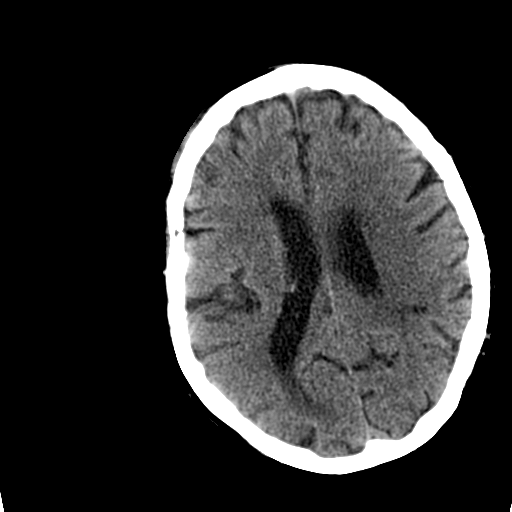
[im 18/30  brain]
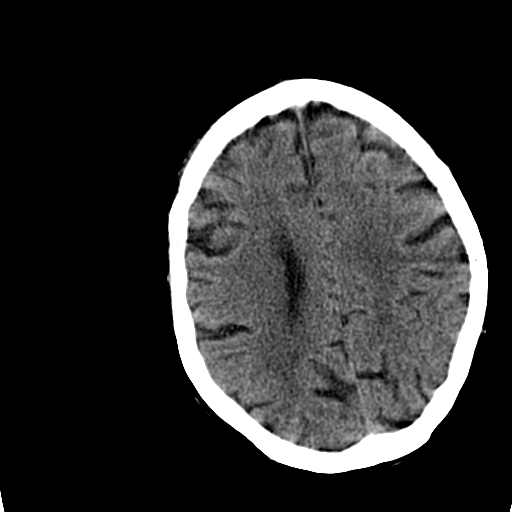
[im 18/30  bone]
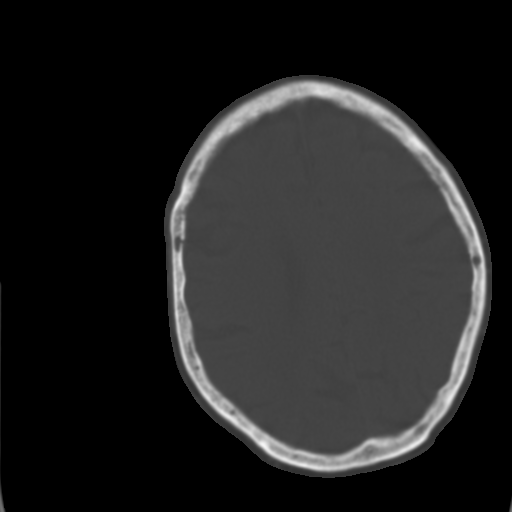
[im 19/30  brain]
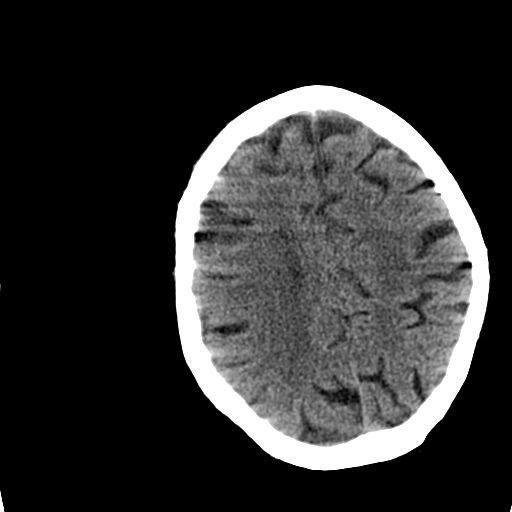
[im 21/30  brain]
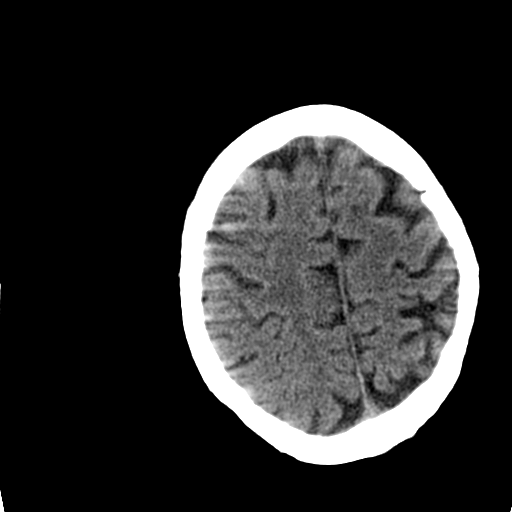
[im 23/30  brain]
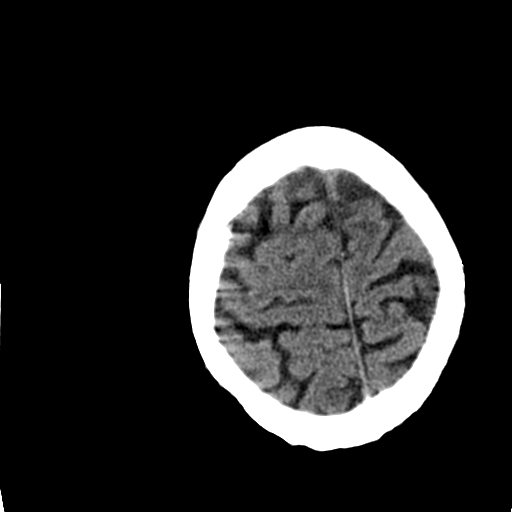
[im 24/30  brain]
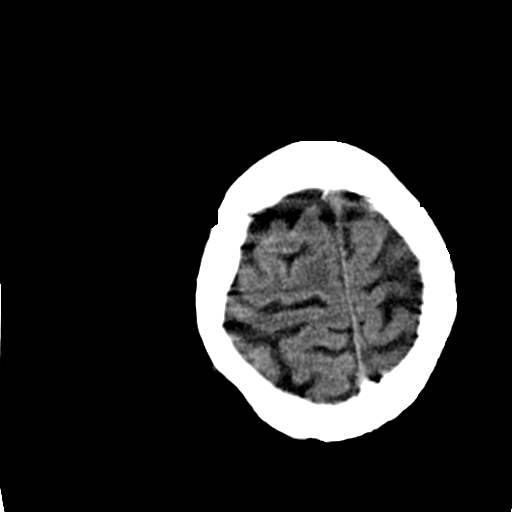
[im 24/30  bone]
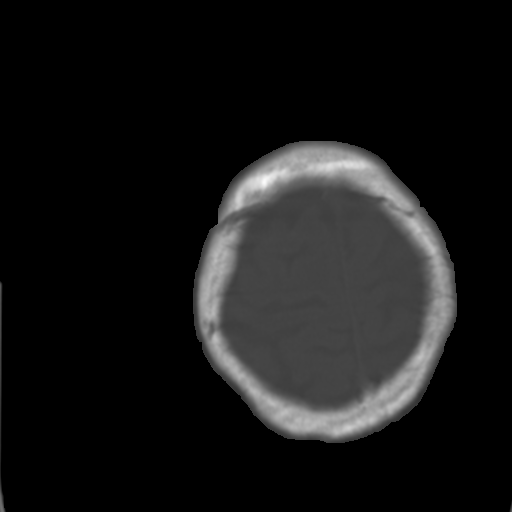
[im 26/30  brain]
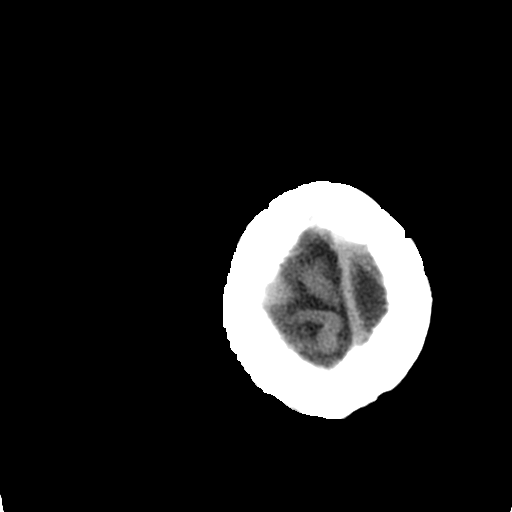
[im 28/30  brain]
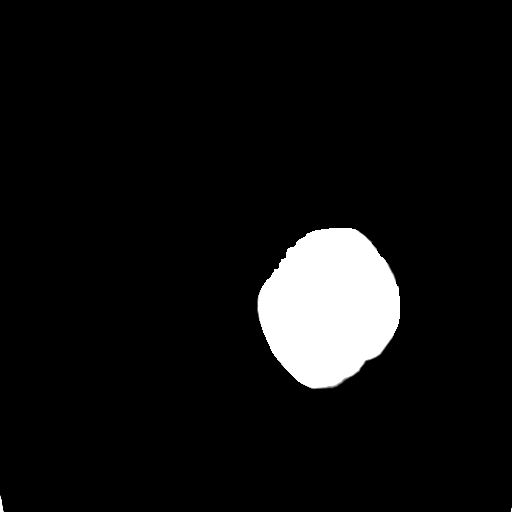

[15 of 30 positions shown; findings below may reference images not displayed]

FINDINGS: Several images were repeated due to motion.  There
appears to be some soft tissue swelling in the right frontal scalp.
There is no evidence of acute intracranial hemorrhage, mass lesion,
brain edema or extra-axial fluid collection.  There is stable
generalized atrophy with mild periventricular white matter disease,
likely due to chronic small vessel ischemia.

Chronic right maxillary sinus opacification is noted.  The
additional visualized paranasal sinuses are clear.  There is no
evidence of calvarial fracture.
IMPRESSION: No acute intracranial findings demonstrated.  Stable atrophy and
chronic small vessel ischemic changes.

## 2013-03-03 IMAGING — CR DG CHEST 1V PORT
1 series · 1 of 1 positions shown · non-contrast
Comparison: Chest 05/05/2011.

CLINICAL DATA: Status post intubation.

PORTABLE CHEST - 1 VIEW

[view not recorded]
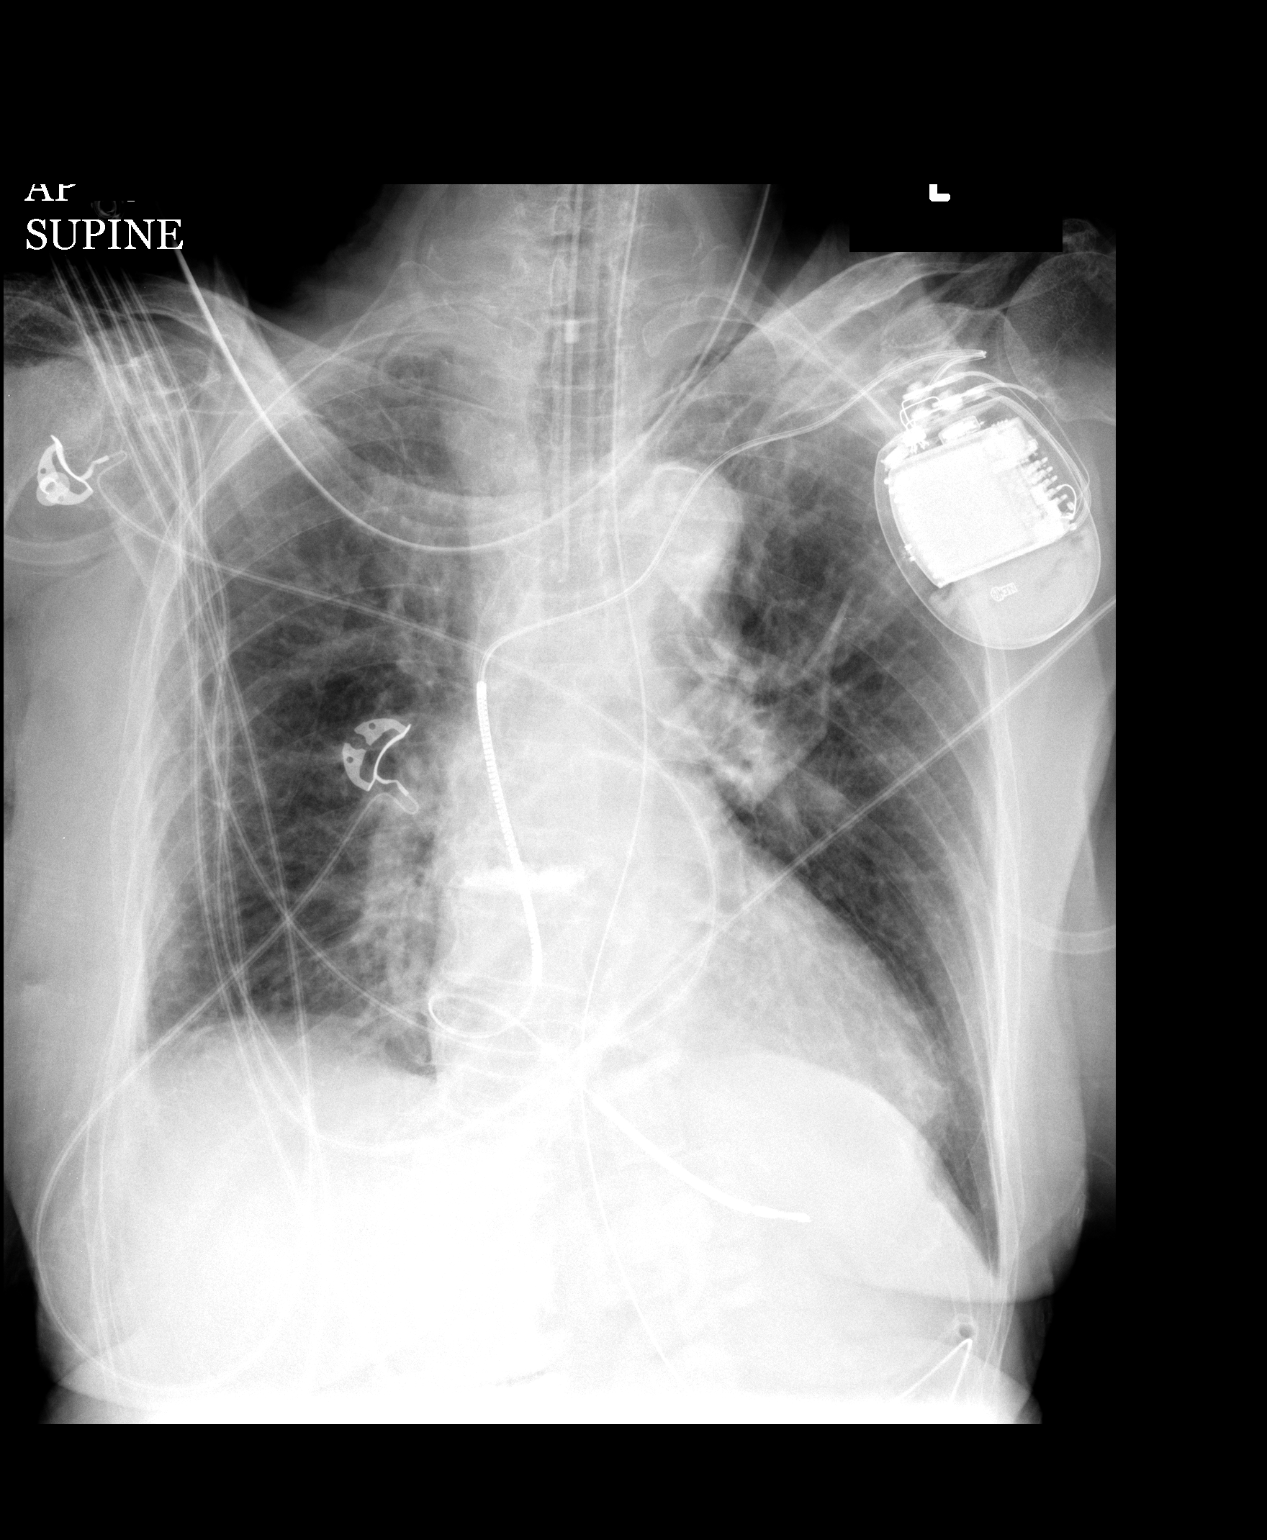

[1 of 1 positions shown; findings below may reference images not displayed]

FINDINGS: The patient has a new endotracheal tube in place with tip
in good position, well above the carina.  NG tube courses into the
stomach and below the inferior margin of the film.  The chest is
better expanded with decreased bilateral airspace disease.  There
is likely a small right pleural effusion.  Heart size upper normal.
IMPRESSION: ET tube in good position with improved pulmonary expansion.

## 2013-10-18 DEATH — deceased

## 2015-04-09 NOTE — Consult Note (Signed)
Brief Consult Note: Diagnosis: odontoid basd fracture.   Patient was seen by consultant.   Consult note dictated.   Comments: appears to be old. cervical brace discontinued.  Electronic Signatures: Leitha SchullerMenz, Rochell Mabie J (MD)  (Signed 21-Feb-14 16:40)  Authored: Brief Consult Note   Last Updated: 21-Feb-14 16:40 by Leitha SchullerMenz, Adler Chartrand J (MD)

## 2015-04-09 NOTE — H&P (Signed)
PATIENT NAME:  [name], [name] MR#:  [id number] DATE OF BIRTH:  01/04/1919  DATE OF ADMISSION:  02/06/2013  Addendum.  This is a continuation.  PAST MEDICAL HISTORY: 1.  Vertebral fracture.  2.  Hyperlipidemia.  3.  Hypothyroidism.  4.  Dementia.  5.  Glaucoma.  6.  GERD.  7.  History of UTIs.  8.  History of hypertension in the past.   9.  Records received from [facility] showed that the patient had also atrial fibrillation in the past, but now she is normal sinus rhythm.  She did have a history of ventricular fibrillation as per her son in 2006 and she had an AICD.  There is also mention that she had an MI, but the son says that she has never had an MI or heart problems other than the ventricular fibrillation.   ALLERGIES:  No known drug allergies.   PAST SURGICAL HISTORY:  AICD placement and a heart cath when she had the V-Fib and son states that she had clean coronaries.   SOCIAL HISTORY:  Denies any tobacco, alcohol.  She lives in the [address] since April of 2013.   FAMILY HISTORY:  Her mother, her brother and sister had heart attacks.  No cancer in the family.    MEDICATIONS:  Vitamin D, simvastatin 40 mg once daily, Timolol 0.1 mg ophthalmic, Ocuvite 1 capsule once a day, Mylanta 400 mg, mirtazapine 15 mg take 0.5 tablets once a day, milk of magnesia as needed, Tylenol as needed, magnesium gluconate 1 tablet once a day, Lyrica 25 mg once a day, lorazepam 0.5 mg 3 times a day, levothyroxine 50 mcg once a day, Keppra 250 mg 2 times daily, Imodium AD once a day, guaifenesin 100 mg, donepezil 5 mg once a day, aspirin 81 mg once a day.   REVIEW OF SYSTEMS:   Unable to obtain review of systems due to patient's severe dementia and confusion.  As far as I can tell from the family, the patient is very communicative, but pleasantly confused, but right now she is not communicating.   PHYSICAL EXAMINATION: VITAL SIGNS:  Blood pressure 151/80, pulse 102, respirations 16, temperature 98, oxygen  saturation 93% to 97% on room air.   GENERAL:  The patient is awake.  She is not communicative.  She looks chronically debilitated and dehydrated.  HEENT:  Her pupils are equal and reactive.  Extraocular movements are intact.  Mucosa are very dry, cracked lips.  Tongue is central.  NECK:  Supple.  No JVD.  No thyromegaly.  There is no pain to palpation of the neck at any point, although whenever the patient is asked she says that she has pain on her neck.  As per ER physician the patient did not have any pain on her neck before she had the collar placed.  The patient had a collar placed.  No JVD.  No thyromegaly.  No adenopathy.  No carotid bruits.  CARDIOVASCULAR:  Regular rate and rhythm, slightly tachycardic.  No murmurs, rubs or gallops.  No tenderness to palpation of anterior chest wall.  LUNGS:  Clear without any wheezing or crepitus.  No use of accessory muscles.  No dullness to percussion.  ABDOMEN:  Soft, nontender, nondistended.  No hepatosplenomegaly.  No masses.  Bowel sounds are positive.  GENITAL:  Negative for external lesions.  EXTREMITIES:  No edema, cyanosis, clubbing.  Pulses +2.  Capillary refill less than 3.  SKIN:  Without any rashes or petechiae, dehydrated skin with  decreased turgor. NEUROLOGIC:  The patient is not cooperative, but she moves all four extremities and she retracts to pain.  She is nonverbal.  PSYCHIATRIC:  No signs of agitation.  The patient overall nonverbal.  LYMPHATIC:  Negative for lymphadenopathies in the neck or supraclavicular areas.  MUSCULOSKELETAL:  No significant joint deformities.   LABORATORY WORK:  Glucose 91, BUN 26, sodium 152, potassium 3.7, chloride 113, albumin 3.3, white count 15,000, hemoglobin 13.9.  UA has increased white blood cells at 36, positive leukocyte esterase, positive nitrites.  EKG, sinus tachycardia with some PACs.  CT of the neck showed, as mentioned above, comminuted fracture of the odontoid, is unsure if it is chronic or not.    ASSESSMENT AND PLAN:  The patient is a 81 year old female with history of vertebral fracture in the past which by her family it was only hairline, minimal, on possible vertebral body.  No odontoid process.  No comminuted fractures that they were aware of, hyperlipidemia, hypothyroidism, dementia, glaucoma, gastroesophageal reflux disease, history of multiple urinary tract infections and a history of ventricular fibrillation for what she has an automatic implantable cardiac defibrillator.   1.  Multiple falls.  The patient has a history of at least two falls today for what triggered the x-rays, the CT of the head and the CT of the neck.  Odontoid fracture seems to be chronic, although unfortunately I cannot clear the patient as she is to have been complaining of pain.  She was not tender to palpation, but she is complaining of pain.  What we are going to try to do is #1, try to see if tomorrow she is more alert after antibiotics have been started and fluids have been given.  If she is more alert, awake, and she can tell us there is no pain, might be easier to remove the collar.  Next, the other thing is try to figure out if there is a previous scan.  We called the family.  [facility].  The other option is to call Dr. Allena KatzPatel from radiology, who is an expert on musculoskeletal to see if he can give us some more guidance as far as if this is chronic.  If this is chronic we can remove the collar.  The other option is getting an MRI of the neck.  I discussed the case with Dr. Claris Gladdenamasunder who is an orthopedic doctor.  She is not a specialist on the spine, but she gave me good counseling about being cautious about removing the collar.  2.  Urinary tract infection.  The patient is going to be treated with Rocephin for urinary tract infection.  Fluids are going to be given.  3.  Dehydration.  The patient has dehydration due to possible not eating.  She had an  episode of vomiting and diarrhea due to a viral infection last week and that might be also creating some consequences of that.  Apparently, she does not have diarrhea anymore, but she has not been eating well.  4.  History of hypothyroidism.  Continue replacement.  5.  Dementia.  Continue current home medications.  6.  History of ventricular fibrillation.  The patient has an automatic implantable cardiac defibrillator.  It has not gave trouble in a long time.  It has never gone off as far as the family can tell me.  7.  The patient is a DO NOT RESUSCITATE as per her son.  8.  The patient is going to have a PPI for gastrointestinal prophylaxis.   TIME SPENT:  I spent about 50 minutes with this patient and family members.  Case discussed also with  Dr. Claris Gladden.  Dr. Claris Gladden has not been formally consulted on the case.  She was just really nice enough to give me some advice.    ____________________________ Felipa Furnace, MD rsg:ea D: 02/06/2013 22:57:02 ET T: 02/07/2013 00:41:21 ET JOB#: [id number]  cc: Felipa Furnace, MD, <Dictator> Kealii Thueson Juanda Chance MD ELECTRONICALLY SIGNED 02/08/2013 23:02

## 2015-04-09 NOTE — Consult Note (Signed)
   Comments   I spoke with pt's son, Aviva KluverSkip Stech, by phone. Son is in [place]. Updated him on pt's current condtion and continued decline. Suggested that the [facility] appropriate place for pt. He agrees with plan. Will contact [facility] liason.  Dx: Dementia  Secondary Dx: FTT (BMI 17.1, PPS 20%), V.fib arrest s/p AICD spent: 15 minutes  Electronic Signatures: Balthazar Dooly, Harriett SineNancy (MD)  (Signed 26-Feb-14 21:22)  Authored: Palliative Care   Last Updated: 26-Feb-14 21:22 by Beatric Fulop, Harriett SineNancy (MD)

## 2015-04-09 NOTE — Discharge Summary (Signed)
PATIENT NAME:  [name] [name], [name] [name] MR#:  [id number] DATE OF BIRTH:  July 31, 1919  DATE OF ADMISSION:  02/06/2013 DATE OF DISCHARGE:  02/14/2013    DISCHARGE DIAGNOSES: Altered mental status, agitation, fall, dehydration, cervical vertebral fracture (old), urinary tract infection, gastroesophageal reflux disease, hypertension, hyperlipidemia, hypothyroidism, dementia, glaucoma, atrial fibrillation now in sinus rhythm, ventricular fibrillation, status post automatic implantable cardiac defibrillator implant.   HISTORY OF PRESENTING ILLNESS: A 81 year old female with history of vertebral fracture in 2010, hyperlipidemia, hypothyroidism, dementia, glaucoma, has a history of atrial fibrillation for which she required AICD placement in the past in 2006, came with a history of being here a week ago with dehydration due to acute gastroenteritis, and that was going on in the nursing home. She was sent back over here due to weakness and dehydration. On hospital arrival, she was found having hypernatremia, severe dehydration, UTI, and so she was started on treatment for that. She also had A. fib with rapid regular rhythm and with hydration, the rate came under control. Three troponins were negative and with a few doses of Cardizem, she converted to sinus rhythm and remained under control.  1.  Agitation, altered mental status: Possibly the etiology of this was due to dehydration and delirium in the hospital. A CT of the head was done which shows no intracranial abnormality,  chronic microvascular ischemic changes, and she remained stable.  2.  Dehydration: Continued with IV fluids and p.o. intake and she failed SLP evaluation. Palliative care also saw the patient. Slowly with improvement and intervention with nursing support on diet, she started eating purified diet.  3.  Hypernatremia: She received D5W, and slowly hypernatremia resolved over the next few days.  4.  V-tach, nonsustained: Magnesium level was low and it was  replaced, and then she remained in a normal sinus rhythm.  5.  UTI: 60,000 units of E. coli. She was on Rocephin initially, then changed to oral and then discontinued after 8 days.  IMPORTANT LABORATORY AND RADIOLOGICAL RESULTS IN THE HOSPITAL: On arrival, her sodium was 152 and chloride was 113, which came down normal after replacement. Magnesium level was 1.5, which was replaced and came up. Urinalysis was positive with 36 WBCs in the urine and E. coli in the urine culture, sensitive to all usual antibacterial agents. CT head done twice in the hospital: No acute intracranial finding.   DISCHARGE MEDICATIONS:  1.  Simvastatin 40 mg oral tablet once a day.  2.  Ocuvite oral capsule once a day.  3.  Aspirin 81 mg oral tablet once a day.  4.  Levothyroxine 50 mcg once a day.  5.  Donepezil 5 mg oral tablet once a day.  6.  Lyrica 25 mg oral capsule once a day.  7.  Keppra 250 mg oral tablet 2 times a day.  8.  Magnesium gluconate once a day.  9.  Vitamin D3, 2000 international units once a day.  10.  Imodium as needed for diarrhea.  11.  Guaifenesin 100 mg/5 mL oral liquid every 6 hours as needed.  12.  Lorazepam 0.5 mg tablet 3 times a day as needed for agitation.  13.  Milk of magnesia 8% oral suspension 30 mL orally once a day as needed.  14.  Mirtazapine 15 mg oral tablet, take 1/2 tablet once a day.  15.  Pramipexole 0.25 mg oral tablet every 8 hours as needed.  16.  Cephalexin 250 mg oral capsule every 8 hours.  HOME OXYGEN ON DISCHARGE: No.  DIET ON DISCHARGE: Low sodium, low fat, low cholesterol, pureed diet. Special instructions: Moisten the food well. Send ice cream at lunch and dinner, or else pudding and yogurt.  ACTIVITY: As tolerated.   TIMEFRAME TO FOLLOW: Within 1 to 2 weeks.   ADVISED ON DISCHARGE: Routine followups and check for dehydration, as does not have good urge to eat and drink. Needs assistance with feeding.  TOTAL TIME SPENT ON THIS DISCHARGE: 45 minutes.     ____________________________ Hope PigeonVaibhavkumar G. Elisabeth PigeonVachhani, MD vgv:jm D: 02/14/2013 14:35:51 ET T: 02/14/2013 15:09:12 ET JOB#: [id number]  cc: Hope PigeonVaibhavkumar G. Elisabeth PigeonVachhani, MD, <Dictator> Outside Physician Altamese DillingVAIBHAVKUMAR Tatijana Bierly MD ELECTRONICALLY SIGNED 02/16/2013 23:10

## 2015-04-09 NOTE — H&P (Signed)
PATIENT NAME:  [name] [name], [name] [name] MR#:  [id number] DATE OF BIRTH:  [birth year]  DATE OF ADMISSION:  02/06/2013  PRIMARY CARE PHYSICIAN: None local. The patient sees Dr. Charolotte EkeFrelstien from visiting physicians on [facility].   CHIEF COMPLAINT: Multiple falls, dehydration.   REFERRING PHYSICIAN:  Dr. Shaune PollackLord.   HISTORY OF PRESENT ILLNESS: The patient is a 81 year old female with history of vertebral fracture in 2010, hyperlipidemia, hypothyroidism, dementia, glaucoma, GERD, history of UTIs and hypertension diagnosed in the past, although she is not taking any blood pressure medications. She has history of B-fib for which she required an AICD in the past, that was in 2006. The patient comes with a history of being here a week ago with dehydration, due to acute gastroenteritis that was going on into her nursing home. She was sent back home after [facility] ENDS HERE.    ____________________________ Felipa Furnaceoberto Sanchez Gutierrez, MD rsg:cc D: 02/06/2013 21:30:52 ET T: 02/06/2013 22:17:25 ET JOB#: [id number]  cc: Felipa Furnaceoberto Sanchez Gutierrez, MD, <Dictator> Filiberto Wamble Juanda ChanceSANCHEZ GUTIERRE MD ELECTRONICALLY SIGNED 02/08/2013 23:01

## 2015-04-09 NOTE — Consult Note (Signed)
   Comments   I had a phone conversation with pt's son, Aviva KluverSkip Busey, who lives in [place]. He has not seen pt in ~ a year but was under the impression that she was doing fairly well at [facility]. He is hopeful that she will be able to return there at discharge. However, he realizes that if pt does not improve or declines further, she may have to go to SNF. He gave me local contacts aunt Arabella MerlesBrenda Albright and neighbor Heloise BeechamVicki Cheek who might give me a more accurate picture of pt's baseline. Will follow up with them.  spoke with son about code status and explained the futility of defibrillation in a pt with an AICD who is otherwise a DNR. He expressed understanding and agrees with DNR. Order entered. Out-of-facility DNR completed and placed on chart.   Electronic Signatures: Ambika Zettlemoyer, Harriett SineNancy (MD)  (Signed 24-Feb-14 15:42)  Authored: Palliative Care   Last Updated: 24-Feb-14 15:42 by Issai Werling, Harriett SineNancy (MD)

## 2015-04-09 NOTE — Consult Note (Signed)
PATIENT NAME:  [name] [name], [name] [name] MR#:  [id number] DATE OF BIRTH:  [birth year]  DATE OF CONSULTATION:  02/07/2013  REFERRING PHYSICIAN:   CONSULTING PHYSICIAN:  Leitha SchullerMichael J. Reghan Thul, MD  REASON FOR CONSULTATION: C2 fracture.   HISTORY OF PRESENT ILLNESS: The patient is a 81 year old female who is a nursing home resident. She has had frequent falls and is being admitted for medical problems. She had a head and neck CT. On the neck CT, she was found to have an odontoid fracture and she has been in a collar. She is unable to give history. She appears confused and has a history of dementia.   PHYSICAL EXAMINATION: She has general rigidity of joints. She does not appear to be particularly tender on palpation of the neck, shoulders, arms. She does not have any clonus, no hyperreflexia. Sensation cannot be tested secondary to the patient's inability to respond.  RADIOLOGICAL DATA: Review of the CT shows a fracture of the odontoid at the base with slight displacement. There are rounded edges on the CT. There is nothing about the CT which makes this appear to be an acute problem.   IMPRESSION: Old C2 base of odontoid fracture with minimal displacement.   RECOMMENDATIONS: Discontinue the rigid cervical collar and followup study only if she starts developing hyperreflexia or other neurologic complications, which I think are very unlikely.    ____________________________ Leitha SchullerMichael J. Maikel Neisler, MD mjm:jm D: 02/07/2013 16:38:51 ET T: 02/07/2013 22:00:58 ET JOB#: [id number]  cc: Leitha SchullerMichael J. Louvina Cleary, MD, <Dictator> Leitha SchullerMICHAEL J Merrick Feutz MD ELECTRONICALLY SIGNED 02/08/2013 23:11
